# Patient Record
Sex: Female | Born: 1950 | Race: White | Hispanic: No | Marital: Married | Smoking: Former smoker
Health system: Southern US, Community
[De-identification: ages and names within clinical notes are randomized; demographics above are authoritative.]

## PROBLEM LIST (undated history)

## (undated) DIAGNOSIS — E7800 Pure hypercholesterolemia, unspecified: Secondary | ICD-10-CM

## (undated) DIAGNOSIS — M1990 Unspecified osteoarthritis, unspecified site: Secondary | ICD-10-CM

## (undated) DIAGNOSIS — K219 Gastro-esophageal reflux disease without esophagitis: Secondary | ICD-10-CM

## (undated) DIAGNOSIS — S060XAA Concussion with loss of consciousness status unknown, initial encounter: Secondary | ICD-10-CM

## (undated) DIAGNOSIS — E119 Type 2 diabetes mellitus without complications: Secondary | ICD-10-CM

## (undated) DIAGNOSIS — Z973 Presence of spectacles and contact lenses: Secondary | ICD-10-CM

## (undated) DIAGNOSIS — E039 Hypothyroidism, unspecified: Secondary | ICD-10-CM

## (undated) HISTORY — PX: APPENDECTOMY: SHX54

## (undated) HISTORY — PX: OVARIAN CYST REMOVAL: SHX89

---

## 1998-09-11 ENCOUNTER — Emergency Department (HOSPITAL_COMMUNITY): Admission: EM | Admit: 1998-09-11 | Discharge: 1998-09-11 | Payer: Self-pay

## 2000-02-02 ENCOUNTER — Other Ambulatory Visit: Admission: RE | Admit: 2000-02-02 | Discharge: 2000-02-02 | Payer: Self-pay | Admitting: Obstetrics and Gynecology

## 2002-08-03 HISTORY — PX: OVARIAN CYST REMOVAL: SHX89

## 2004-03-25 ENCOUNTER — Other Ambulatory Visit: Admission: RE | Admit: 2004-03-25 | Discharge: 2004-03-25 | Payer: Self-pay | Admitting: *Deleted

## 2007-09-27 ENCOUNTER — Other Ambulatory Visit: Admission: RE | Admit: 2007-09-27 | Discharge: 2007-09-27 | Payer: Self-pay | Admitting: Family Medicine

## 2014-11-29 ENCOUNTER — Other Ambulatory Visit: Payer: Self-pay | Admitting: Family Medicine

## 2014-11-29 DIAGNOSIS — E049 Nontoxic goiter, unspecified: Secondary | ICD-10-CM

## 2014-11-30 ENCOUNTER — Ambulatory Visit (INDEPENDENT_AMBULATORY_CARE_PROVIDER_SITE_OTHER): Payer: BC Managed Care – PPO

## 2014-11-30 DIAGNOSIS — E049 Nontoxic goiter, unspecified: Secondary | ICD-10-CM

## 2014-11-30 DIAGNOSIS — E042 Nontoxic multinodular goiter: Secondary | ICD-10-CM | POA: Diagnosis not present

## 2014-12-26 ENCOUNTER — Other Ambulatory Visit: Payer: Self-pay | Admitting: Surgery

## 2014-12-26 DIAGNOSIS — E042 Nontoxic multinodular goiter: Secondary | ICD-10-CM

## 2014-12-27 ENCOUNTER — Other Ambulatory Visit: Payer: Self-pay | Admitting: Surgery

## 2014-12-27 DIAGNOSIS — E042 Nontoxic multinodular goiter: Secondary | ICD-10-CM

## 2015-01-16 ENCOUNTER — Other Ambulatory Visit (HOSPITAL_COMMUNITY)
Admission: RE | Admit: 2015-01-16 | Discharge: 2015-01-16 | Disposition: A | Discharge status: Home / Self Care | Payer: BC Managed Care – PPO | Source: Ambulatory Visit | Attending: Diagnostic Radiology | Admitting: Diagnostic Radiology

## 2015-01-16 ENCOUNTER — Ambulatory Visit: Admission: RE | Admit: 2015-01-16 | Payer: BC Managed Care – PPO | Source: Ambulatory Visit

## 2015-01-16 ENCOUNTER — Ambulatory Visit
Admission: RE | Admit: 2015-01-16 | Discharge: 2015-01-16 | Disposition: A | Discharge status: Home / Self Care | Payer: BC Managed Care – PPO | Source: Ambulatory Visit | Attending: Surgery | Admitting: Surgery

## 2015-01-16 DIAGNOSIS — E042 Nontoxic multinodular goiter: Secondary | ICD-10-CM | POA: Diagnosis not present

## 2015-01-22 ENCOUNTER — Other Ambulatory Visit: Payer: Self-pay | Admitting: Surgery

## 2015-01-22 DIAGNOSIS — E042 Nontoxic multinodular goiter: Secondary | ICD-10-CM

## 2015-07-03 ENCOUNTER — Other Ambulatory Visit: Payer: Self-pay | Admitting: Surgery

## 2015-07-03 DIAGNOSIS — E042 Nontoxic multinodular goiter: Secondary | ICD-10-CM

## 2015-07-08 ENCOUNTER — Ambulatory Visit
Admission: RE | Admit: 2015-07-08 | Discharge: 2015-07-08 | Disposition: A | Discharge status: Home / Self Care | Payer: BC Managed Care – PPO | Source: Ambulatory Visit | Attending: Surgery | Admitting: Surgery

## 2015-07-08 DIAGNOSIS — E042 Nontoxic multinodular goiter: Secondary | ICD-10-CM

## 2016-07-15 ENCOUNTER — Other Ambulatory Visit: Payer: Self-pay | Admitting: Surgery

## 2016-07-15 DIAGNOSIS — E049 Nontoxic goiter, unspecified: Secondary | ICD-10-CM

## 2016-07-23 ENCOUNTER — Ambulatory Visit
Admission: RE | Admit: 2016-07-23 | Discharge: 2016-07-23 | Disposition: A | Discharge status: Home / Self Care | Payer: Medicare Other | Source: Ambulatory Visit | Attending: Surgery | Admitting: Surgery

## 2016-07-23 DIAGNOSIS — E049 Nontoxic goiter, unspecified: Secondary | ICD-10-CM

## 2017-01-28 ENCOUNTER — Other Ambulatory Visit (HOSPITAL_COMMUNITY): Payer: Self-pay | Admitting: Dermatology

## 2017-01-28 DIAGNOSIS — D485 Neoplasm of uncertain behavior of skin: Secondary | ICD-10-CM

## 2017-02-02 ENCOUNTER — Ambulatory Visit (HOSPITAL_COMMUNITY)
Admission: RE | Admit: 2017-02-02 | Discharge: 2017-02-02 | Disposition: A | Discharge status: Home / Self Care | Payer: Medicare Other | Source: Ambulatory Visit | Attending: Dermatology | Admitting: Dermatology

## 2017-02-02 DIAGNOSIS — D485 Neoplasm of uncertain behavior of skin: Secondary | ICD-10-CM | POA: Insufficient documentation

## 2017-07-19 ENCOUNTER — Other Ambulatory Visit: Payer: Self-pay | Admitting: Surgery

## 2017-07-19 DIAGNOSIS — E042 Nontoxic multinodular goiter: Secondary | ICD-10-CM

## 2017-08-24 ENCOUNTER — Ambulatory Visit
Admission: RE | Admit: 2017-08-24 | Discharge: 2017-08-24 | Disposition: A | Discharge status: Home / Self Care | Payer: Medicare Other | Source: Ambulatory Visit | Attending: Surgery | Admitting: Surgery

## 2017-08-24 DIAGNOSIS — E042 Nontoxic multinodular goiter: Secondary | ICD-10-CM

## 2017-09-01 ENCOUNTER — Other Ambulatory Visit: Payer: Self-pay | Admitting: Surgery

## 2017-09-01 DIAGNOSIS — E042 Nontoxic multinodular goiter: Secondary | ICD-10-CM

## 2017-09-08 ENCOUNTER — Other Ambulatory Visit (HOSPITAL_COMMUNITY)
Admission: RE | Admit: 2017-09-08 | Discharge: 2017-09-08 | Disposition: A | Discharge status: Home / Self Care | Payer: Medicare Other | Source: Ambulatory Visit | Attending: Student | Admitting: Student

## 2017-09-08 ENCOUNTER — Ambulatory Visit
Admission: RE | Admit: 2017-09-08 | Discharge: 2017-09-08 | Disposition: A | Discharge status: Home / Self Care | Payer: Medicare Other | Source: Ambulatory Visit | Attending: Surgery | Admitting: Surgery

## 2017-09-08 DIAGNOSIS — E042 Nontoxic multinodular goiter: Secondary | ICD-10-CM | POA: Diagnosis present

## 2018-08-26 ENCOUNTER — Other Ambulatory Visit: Payer: Self-pay | Admitting: Surgery

## 2018-08-26 DIAGNOSIS — E042 Nontoxic multinodular goiter: Secondary | ICD-10-CM

## 2018-08-31 ENCOUNTER — Ambulatory Visit
Admission: RE | Admit: 2018-08-31 | Discharge: 2018-08-31 | Disposition: A | Discharge status: Home / Self Care | Payer: Medicare Other | Source: Ambulatory Visit | Attending: Surgery | Admitting: Surgery

## 2018-08-31 DIAGNOSIS — E042 Nontoxic multinodular goiter: Secondary | ICD-10-CM

## 2018-09-12 ENCOUNTER — Other Ambulatory Visit (HOSPITAL_COMMUNITY): Payer: Self-pay

## 2018-09-12 DIAGNOSIS — R1310 Dysphagia, unspecified: Secondary | ICD-10-CM

## 2018-09-23 ENCOUNTER — Ambulatory Visit (HOSPITAL_COMMUNITY)
Admission: RE | Admit: 2018-09-23 | Discharge: 2018-09-23 | Disposition: A | Discharge status: Home / Self Care | Payer: Medicare Other | Source: Ambulatory Visit | Attending: Surgery | Admitting: Surgery

## 2018-09-23 DIAGNOSIS — R1310 Dysphagia, unspecified: Secondary | ICD-10-CM

## 2018-09-23 NOTE — Progress Notes (Signed)
Modified Barium Swallow Progress Note  Patient Details  Name: Karina Herman MRN: [id number] Date of Birth: 1950-11-26  Today's Date: 09/23/2018  Modified Barium Swallow completed.  Full report located under Chart Review in the Imaging Section.  Brief recommendations include the following:  Clinical Impression  Pt's oropharyngeal swallow is WFL with good timing, efficiency, and airway protection. This is no aspiration and no pharyngeal residue. Pt did have mild, transient residue in her cervical esophagus, which may suggest the beginnings of an outpouching, but any residue that was present upon completion of the swallow was fully cleared before the next swallow began. The pill appeared to clear her esophagus well, although requiring an additional liquid bolus. Recommend continuing regular solids and thin liquids as tolerated. A handout was provided about esophageal precautions - could consider an esophageal w/u if symptoms persist given pt report of heartburn.   Swallow Evaluation Recommendations   Recommended Consults: Consider esophageal assessment   SLP Diet Recommendations: Regular solids;Thin liquid   Liquid Administration via: Cup;Straw   Medication Administration: Whole meds with liquid   Supervision: Patient able to self feed   Compensations: Slow rate;Small sips/bites;Follow solids with liquid   Postural Changes: Seated upright at 90 degrees;Remain semi-upright after after feeds/meals (Comment)   Oral Care Recommendations: Oral care BID        Venita Sheffield Nayeliz Hipp 09/23/2018,11:35 AM   Nuala Alpha, M.A. [place] [facility] Environmental education officer [phone] Office [phone]

## 2019-01-27 ENCOUNTER — Ambulatory Visit: Payer: Self-pay | Admitting: Surgery

## 2019-01-27 NOTE — H&P (Signed)
General Surgery [facility]  Karina Herman DOB: 1950/10/30 Married / Language: Cleophus Molt / Race: White Female   History of Present Illness   The patient is a 68 year old female who presents with a complaint of Enlarged thyroid.  CHIEF COMPLAINT: multinodular thyroid goiter with compressive symptoms  The patient returns today to discuss results of her speech pathology evaluation and swallowing studies. Patient has a multinodular thyroid goiter with bilateral thyroid nodules. Her most recent ultrasound exam showed some changes in the character of bilateral nodules and fine-needle aspiration biopsies were recommended. However, the patient continues to have compressive symptoms and is interested in thyroidectomy. Prior to thyroid surgery we asked for an evaluation including a barium swallow and a speech pathology evaluation. Both of these were completed the spring. Both studies are within normal limits. No significant pathology was identified.  Patient continues to have mild dysphagia. She has problems with large pills. She feels compression in the upper neck. She is interested in proceeding with thyroidectomy to avoid further symptoms and to avoid the need for further ultrasound examinations and biopsies.   Problem List/Past Medical MULTINODULAR GOITER (NONTOXIC) (E04.2)  PILL DYSPHAGIA (R13.10)   Past Surgical History  Appendectomy  Colon Polyp Removal - Colonoscopy  Oral Surgery  Tonsillectomy   Diagnostic Studies History Colonoscopy  5-10 years ago Mammogram  within last year Pap Smear  1-5 years ago  Allergies  No Known Allergies  [12/21/2018]: No Known Drug Allergies  [12/26/2014]: Allergies Reconciled   Medication History  Simvastatin (20MG  Tablet, Oral daily) Active. FreeStyle Lancets (daily) Active. FreeStyle Lite Test (In Vitro daily) Active. Lancets 30G (daily) Active. MetFORMIN HCl (500MG  Tablet, Oral)  Active. Medications Reconciled  Social History  Alcohol use  Occasional alcohol use. Caffeine use  Coffee. No drug use  Tobacco use  Former smoker.  Family History  Cerebrovascular Accident  Mother. Malignant Neoplasm Of Pancreas  Father.  Pregnancy / Birth History Age at menarche  41 years. Age of menopause  38-55 Gravida  2 Maternal age  33-35 Para  2  Other Problems Diabetes Mellitus  Hypercholesterolemia   Vitals Weight: 193.38 lb Height: 63in Body Surface Area: 1.91 m Body Mass Index: 34.25 kg/m  Temp.: 98.45F (Oral)  Pulse: 94 (Regular)  BP: 110/62(Sitting, Left Arm, Standard)  Physical Exam  See vital signs recorded above  GENERAL APPEARANCE Development: normal Nutritional status: normal Gross deformities: none  SKIN Rash, lesions, ulcers: none Induration, erythema: none Nodules: none palpable  EYES Conjunctiva and lids: normal Pupils: equal and reactive Iris: normal bilaterally  EARS, NOSE, MOUTH, THROAT External ears: no lesion or deformity External nose: no lesion or deformity Hearing: grossly normal Lips: no lesion or deformity Dentition: normal for age Oral mucosa: moist  NECK Symmetric: yes Trachea: midline Thyroid: Thyroid gland is moderately to markedly enlarged bilaterally. It is visible on extension of the neck. It is quite firm. There are multiple nodular areas on each lobe of the thyroid gland. There is no tenderness. There is no associated lymphadenopathy.  CHEST Respiratory effort: normal Retraction or accessory muscle use: no Breath sounds: normal bilaterally Rales, rhonchi, wheeze: none  CARDIOVASCULAR Auscultation: regular rhythm, normal rate Murmurs: none Pulses: carotid and radial pulse 2+ palpable Lower extremity edema: none Lower extremity varicosities: none  MUSCULOSKELETAL Station and gait: normal Digits and nails: no clubbing or cyanosis Muscle strength: grossly normal all  extremities Range of motion: grossly normal all extremities Deformity: none  LYMPHATIC Cervical: none palpable Supraclavicular: none  palpable  PSYCHIATRIC Oriented to person, place, and time: yes Mood and affect: normal for situation Judgment and insight: appropriate for situation    Assessment & Plan  MULTINODULAR GOITER (NONTOXIC) (E04.2)   PILL DYSPHAGIA (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 We reviewed the results of her speech pathology evaluation and her barium swallow. I provided her with a copy of the reports from those evaluations.  We discussed the options for management including continued observation with ultrasound examinations and fine-needle aspiration biopsies. Alternatively, we could consider total thyroidectomy. Patient would like to proceed with thyroid surgery. We discussed total thyroidectomy. We discussed the location of the surgical incision. We discussed potential complications including recurrent laryngeal nerve injury and injury to parathyroid glands. We discussed the need for lifelong thyroid hormone replacement. We discussed the hospital stay to be anticipated in the postoperative recovery. Patient understands and wishes to proceed with surgery in the near future.  We will submit orders to the scheduling department for her procedure. However, given the virus pandemic, operating room time is limited and it may be several weeks or a few months until we can get her into the operating room schedule. Patient is aware of these issues and is willing to wait until time is available.  The risks and benefits of the procedure have been discussed at length with the patient. The patient understands the proposed procedure, potential alternative treatments, and the course of recovery to be expected. All of the patient's questions have been answered at this time. The patient wishes to proceed with surgery.  Armandina Gemma, [place] [facility] Office: [phone]

## 2019-02-16 NOTE — Pre-Procedure Instructions (Signed)
Karina Herman  02/16/2019      [id number] - [place], [place] - [address] [place]  [address] Phone: [phone] Fax: [phone]    Your procedure is scheduled on Tues., February 21, 2019 from 7:30AM-9:30AM  Report to [facility] Entrance "A" at 5:30AM  Call this number if you have problems the morning of surgery:  [phone]   Remember:  Do not eat or drink after midnight on July 20th    Take these medicines the morning of surgery with A SIP OF WATER: NONE  As of today, stop taking all Aspirin (unless instructed by your doctor) and Other Aspirin containing products, Vitamins, Fish oils, and Herbal medications. Also stop all NSAIDS i.e. Advil, Ibuprofen, Motrin, Aleve, Anaprox, Naproxen, BC, Goody Powders, and all Supplements.   . Do not take MetFORMIN (GLUCOPHAGE) the morning of surgery.  How to Manage Your Diabetes Before and After Surgery  Why is it important to control my blood sugar before and after surgery? . Improving blood sugar levels before and after surgery helps healing and can limit problems. . A way of improving blood sugar control is eating a healthy diet by: o  Eating less sugar and carbohydrates o  Increasing activity/exercise o  Talking with your doctor about reaching your blood sugar goals . High blood sugars (greater than 180 mg/dL) can raise your risk of infections and slow your recovery, so you will need to focus on controlling your diabetes during the weeks before surgery. . Make sure that the doctor who takes care of your diabetes knows about your planned surgery including the date and location.  How do I manage my blood sugar before surgery? . Check your blood sugar at least 4 times a day, starting 2 days before surgery, to make sure that the level is not too high or low. o Check your blood sugar the morning of your surgery when you wake up and every 2 hours until you get to the Short  Stay unit. . If your blood sugar is less than 70 mg/dL, you will need to treat for low blood sugar: o Do not take insulin. o Treat a low blood sugar (less than 70 mg/dL) with  cup of clear juice (cranberry or apple), 4 glucose tablets, OR glucose gel. Recheck blood sugar in 15 minutes after treatment (to make sure it is greater than 70 mg/dL). If your blood sugar is not greater than 70 mg/dL on recheck, call [phone] o  for further instructions.                       . If your CBG is greater than 220 mg/dL, inform the staff upon arrival to Short Stay.  . If you are admitted to the hospital after surgery: o Your blood sugar will be checked by the staff and you will probably be given insulin after surgery (instead of oral diabetes medicines) to make sure you have good blood sugar levels. o The goal for blood sugar control after surgery is 80-180 mg/dL.  Reviewed and Endorsed by [facility] Patient Education Committee, August 2015   Special instructions:   [facility]- Preparing For Surgery  Before surgery, you can play an important role. Because skin is not sterile, your skin needs to be as free of germs as possible. You can reduce the number of germs on your skin by washing with CHG (chlorahexidine gluconate) Soap  before surgery.  CHG is an antiseptic cleaner which kills germs and bonds with the skin to continue killing germs even after washing.    Please do not use if you have an allergy to CHG or antibacterial soaps. If your skin becomes reddened/irritated stop using the CHG.  Do not shave (including legs and underarms) for at least 48 hours prior to first CHG shower. It is OK to shave your face.  Please follow these instructions carefully.   1. Shower the NIGHT BEFORE SURGERY and the MORNING OF SURGERY with CHG.   2. If you chose to wash your hair, wash your hair first as usual with your normal shampoo.  3. After you shampoo, rinse your hair and body thoroughly to remove the  shampoo.  4. Use CHG as you would any other liquid soap. You can apply CHG directly to the skin and wash gently with a scrungie or a clean washcloth.   5. Apply the CHG Soap to your body ONLY FROM THE NECK DOWN.  Do not use on open wounds or open sores. Avoid contact with your eyes, ears, mouth and genitals (private parts). Wash Face and genitals (private parts)  with your normal soap.  6. Wash thoroughly, paying special attention to the area where your surgery will be performed.  7. Thoroughly rinse your body with warm water from the neck down.  8. DO NOT shower/wash with your normal soap after using and rinsing off the CHG Soap.  9. Pat yourself dry with a CLEAN TOWEL.  10. Wear CLEAN PAJAMAS to bed the night before surgery, wear comfortable clothes the morning of surgery  11. Place CLEAN SHEETS on your bed the night of your first shower and DO NOT SLEEP WITH PETS.   Day of Surgery:             Remember to brush your teeth WITH YOUR REGULAR TOOTHPASTE.   Do not wear jewelry, make-up or nail polish.  Do not wear lotions, powders, or perfumes, or deodorant.  Do not shave 48 hours prior to surgery.    Do not bring valuables to the hospital.  [facility] is not responsible for any belongings or valuables.  Contacts, dentures or bridgework may not be worn into surgery.   For patients admitted to the hospital, discharge time will be determined by your treatment team.  Patients discharged the day of surgery will not be allowed to drive home.   Please wear clean clothes to the hospital/surgery center.     Please read over the following fact sheets that you were given. Pain Booklet, Coughing and Deep Breathing and Surgical Site Infection Prevention

## 2019-02-17 ENCOUNTER — Other Ambulatory Visit: Payer: Self-pay

## 2019-02-17 ENCOUNTER — Ambulatory Visit (HOSPITAL_COMMUNITY)
Admission: RE | Admit: 2019-02-17 | Discharge: 2019-02-17 | Disposition: A | Discharge status: Home / Self Care | Payer: Medicare Other | Source: Ambulatory Visit | Attending: Anesthesiology | Admitting: Anesthesiology

## 2019-02-17 ENCOUNTER — Other Ambulatory Visit (HOSPITAL_COMMUNITY)
Admission: RE | Admit: 2019-02-17 | Discharge: 2019-02-17 | Disposition: A | Discharge status: Home / Self Care | Payer: Medicare Other | Source: Ambulatory Visit | Attending: Surgery | Admitting: Surgery

## 2019-02-17 ENCOUNTER — Encounter (HOSPITAL_COMMUNITY)
Admission: RE | Admit: 2019-02-17 | Discharge: 2019-02-17 | Disposition: A | Discharge status: Home / Self Care | Payer: Medicare Other | Source: Ambulatory Visit | Attending: Surgery | Admitting: Surgery

## 2019-02-17 ENCOUNTER — Encounter (HOSPITAL_COMMUNITY): Payer: Self-pay

## 2019-02-17 ENCOUNTER — Encounter (HOSPITAL_COMMUNITY): Payer: Self-pay | Admitting: Surgery

## 2019-02-17 DIAGNOSIS — Z79899 Other long term (current) drug therapy: Secondary | ICD-10-CM | POA: Insufficient documentation

## 2019-02-17 DIAGNOSIS — Z01818 Encounter for other preprocedural examination: Secondary | ICD-10-CM | POA: Insufficient documentation

## 2019-02-17 DIAGNOSIS — E7800 Pure hypercholesterolemia, unspecified: Secondary | ICD-10-CM | POA: Diagnosis not present

## 2019-02-17 DIAGNOSIS — Z7984 Long term (current) use of oral hypoglycemic drugs: Secondary | ICD-10-CM | POA: Insufficient documentation

## 2019-02-17 DIAGNOSIS — Z1159 Encounter for screening for other viral diseases: Secondary | ICD-10-CM | POA: Diagnosis not present

## 2019-02-17 DIAGNOSIS — E042 Nontoxic multinodular goiter: Secondary | ICD-10-CM | POA: Diagnosis not present

## 2019-02-17 DIAGNOSIS — R1310 Dysphagia, unspecified: Secondary | ICD-10-CM | POA: Insufficient documentation

## 2019-02-17 DIAGNOSIS — E119 Type 2 diabetes mellitus without complications: Secondary | ICD-10-CM | POA: Insufficient documentation

## 2019-02-17 HISTORY — DX: Type 2 diabetes mellitus without complications: E11.9

## 2019-02-17 HISTORY — DX: Unspecified osteoarthritis, unspecified site: M19.90

## 2019-02-17 HISTORY — DX: Pure hypercholesterolemia, unspecified: E78.00

## 2019-02-17 LAB — BASIC METABOLIC PANEL
Anion gap: 15 (ref 5–15)
BUN: 15 mg/dL (ref 8–23)
CO2: 22 mmol/L (ref 22–32)
Calcium: 9.9 mg/dL (ref 8.9–10.3)
Chloride: 102 mmol/L (ref 98–111)
Creatinine, Ser: 0.95 mg/dL (ref 0.44–1.00)
GFR calc Af Amer: >60 mL/min (ref >60)
GFR calc non Af Amer: >60 mL/min (ref >60)
Glucose, Bld: 220 mg/dL — ABNORMAL HIGH (ref 70–99)
Potassium: 4.3 mmol/L (ref 3.5–5.1)
Sodium: 139 mmol/L (ref 135–145)

## 2019-02-17 LAB — CBC
HCT: 43.3 % (ref 36.0–46.0)
Hemoglobin: 13.9 g/dL (ref 12.0–15.0)
MCH: 29.4 pg (ref 26.0–34.0)
MCHC: 32.1 g/dL (ref 30.0–36.0)
MCV: 91.7 fL (ref 80.0–100.0)
Platelets: 311 10*3/uL (ref 150–400)
RBC: 4.72 MIL/uL (ref 3.87–5.11)
RDW: 13 % (ref 11.5–15.5)
WBC: 9.1 10*3/uL (ref 4.0–10.5)
nRBC: 0 % (ref 0.0–0.2)

## 2019-02-17 LAB — SARS CORONAVIRUS 2 (TAT 6-24 HRS): SARS Coronavirus 2: NEGATIVE

## 2019-02-17 LAB — GLUCOSE, CAPILLARY: Glucose-Capillary: 191 mg/dL — ABNORMAL HIGH (ref 70–99)

## 2019-02-17 LAB — HEMOGLOBIN A1C
Hgb A1c MFr Bld: 8.4 % — ABNORMAL HIGH (ref 4.8–5.6)
Mean Plasma Glucose: 194.38 mg/dL

## 2019-02-17 NOTE — Progress Notes (Signed)
PCP -  Dr. Orpah Melter; Mat Carne, NP Cardiologist - patient denies  Chest x-ray - 02/17/2019 EKG - 02/17/2019 Stress Test - patient denies ECHO - patient denies Cardiac Cath - patient denies  Sleep Study - patient denies CPAP -   Fasting Blood Sugar - unsure, maybe 150's; patient states she does not check it often. Checks Blood Sugar 0 times a day  Blood Thinner Instructions: Aspirin Instructions:  Anesthesia review: n/a  Patient denies shortness of breath, fever, cough and chest pain at PAT appointment  Coronavirus Screening  Have you experienced the following symptoms:  Cough yes/no: No Fever (>100.98F)  yes/no: No Runny nose yes/no: No Sore throat yes/no: No Difficulty breathing/shortness of breath  yes/no: No  Have you or a family member traveled in the last 14 days and where? yes/no: No   If the patient indicates "YES" to the above questions, their PAT will be rescheduled to limit the exposure to others and, the surgeon will be notified. THE PATIENT WILL NEED TO BE ASYMPTOMATIC FOR 14 DAYS.   If the patient is not experiencing any of these symptoms, the PAT nurse will instruct them to NOT bring anyone with them to their appointment since they may have these symptoms or traveled as well.   Please remind your patients and families that hospital visitation restrictions are in effect and the importance of the restrictions.    Patient verbalized understanding of instructions that were given to them at the PAT appointment. Patient was also instructed that they will need to review over the PAT instructions again at home before surgery.

## 2019-02-17 NOTE — H&P (Signed)
General Surgery [facility]  Lyna Poser DOB: Apr 01, 1951 Married / Language: Cleophus Molt / Race: White Female   History of Present Illness  The patient is a 68 year old female who presents with a complaint of Enlarged thyroid.  CHIEF COMPLAINT: multinodular thyroid goiter with compressive symptoms  The patient returns today to discuss results of her speech pathology evaluation and swallowing studies. Patient has a multinodular thyroid goiter with bilateral thyroid nodules. Her most recent ultrasound exam showed some changes in the character of bilateral nodules and fine-needle aspiration biopsies were recommended. However, the patient continues to have compressive symptoms and is interested in thyroidectomy. Prior to thyroid surgery we asked for an evaluation including a barium swallow and a speech pathology evaluation. Both of these were completed the spring. Both studies are within normal limits. No significant pathology was identified.  Patient continues to have mild dysphagia. She has problems with large pills. She feels compression in the upper neck. She is interested in proceeding with thyroidectomy to avoid further symptoms and to avoid the need for further ultrasound examinations and biopsies.   Problem List/Past Medical MULTINODULAR GOITER (NONTOXIC) (E04.2)  PILL DYSPHAGIA (R13.10)   Past Surgical History  Appendectomy  Colon Polyp Removal - Colonoscopy  Oral Surgery  Tonsillectomy   Diagnostic Studies History  Colonoscopy  5-10 years ago Mammogram  within last year Pap Smear  1-5 years ago  Allergies  No Known Allergies  [12/21/2018]: No Known Drug Allergies  [12/26/2014]: Allergies Reconciled   Medication History  Simvastatin (20MG  Tablet, Oral daily) Active. FreeStyle Lancets (daily) Active. FreeStyle Lite Test (In Vitro daily) Active. Lancets 30G (daily) Active. MetFORMIN HCl (500MG  Tablet, Oral)  Active. Medications Reconciled  Social History  Alcohol use  Occasional alcohol use. Caffeine use  Coffee. No drug use  Tobacco use  Former smoker.  Family History  Cerebrovascular Accident  Mother. Malignant Neoplasm Of Pancreas  Father.  Pregnancy / Birth History Age at menarche  52 years. Age of menopause  69-55 Gravida  2 Maternal age  34-35 Para  2  Other Problems Diabetes Mellitus  Hypercholesterolemia   Vitals  Weight: 193.38 lb Height: 63in Body Surface Area: 1.91 m Body Mass Index: 34.25 kg/m  Temp.: 98.72F (Oral)  Pulse: 94 (Regular)  BP: 110/62(Sitting, Left Arm, Standard)   Physical Exam   See vital signs recorded above  GENERAL APPEARANCE Development: normal Nutritional status: normal Gross deformities: none  SKIN Rash, lesions, ulcers: none Induration, erythema: none Nodules: none palpable  EYES Conjunctiva and lids: normal Pupils: equal and reactive Iris: normal bilaterally  EARS, NOSE, MOUTH, THROAT External ears: no lesion or deformity External nose: no lesion or deformity Hearing: grossly normal Lips: no lesion or deformity Dentition: normal for age Oral mucosa: moist  NECK Symmetric: yes Trachea: midline Thyroid: Thyroid gland is moderately to markedly enlarged bilaterally. It is visible on extension of the neck. It is quite firm. There are multiple nodular areas on each lobe of the thyroid gland. There is no tenderness. There is no associated lymphadenopathy.  CHEST Respiratory effort: normal Retraction or accessory muscle use: no Breath sounds: normal bilaterally Rales, rhonchi, wheeze: none  CARDIOVASCULAR Auscultation: regular rhythm, normal rate Murmurs: none Pulses: carotid and radial pulse 2+ palpable Lower extremity edema: none Lower extremity varicosities: none  MUSCULOSKELETAL Station and gait: normal Digits and nails: no clubbing or cyanosis Muscle strength: grossly normal all  extremities Range of motion: grossly normal all extremities Deformity: none  LYMPHATIC Cervical: none  palpable Supraclavicular: none palpable  PSYCHIATRIC Oriented to person, place, and time: yes Mood and affect: normal for situation Judgment and insight: appropriate for situation    Assessment & Plan  MULTINODULAR GOITER (NONTOXIC) (E04.2) PILL DYSPHAGIA (R13.10)  Patient presents today to discuss thyroid surgery. We reviewed the results of her speech pathology evaluation and her barium swallow. I provided her with a copy of the reports from those evaluations.  We discussed the options for management including continued observation with ultrasound examinations and fine-needle aspiration biopsies. Alternatively, we could consider total thyroidectomy. Patient would like to proceed with thyroid surgery. We discussed total thyroidectomy. We discussed the location of the surgical incision. We discussed potential complications including recurrent laryngeal nerve injury and injury to parathyroid glands. We discussed the need for lifelong thyroid hormone replacement. We discussed the hospital stay to be anticipated in the postoperative recovery. Patient understands and wishes to proceed with surgery in the near future.  We will submit orders to the scheduling department for her procedure. However, given the virus pandemic, operating room time is limited and it may be several weeks or a few months until we can get her into the operating room schedule. Patient is aware of these issues and is willing to wait until time is available.  The risks and benefits of the procedure have been discussed at length with the patient. The patient understands the proposed procedure, potential alternative treatments, and the course of recovery to be expected. All of the patient's questions have been answered at this time. The patient wishes to proceed with surgery.   Armandina Gemma, [place] [facility] Office: [phone]

## 2019-02-19 ENCOUNTER — Encounter (HOSPITAL_COMMUNITY): Payer: Self-pay | Admitting: Surgery

## 2019-02-20 NOTE — Anesthesia Preprocedure Evaluation (Addendum)
Anesthesia Evaluation  Patient identified by MRN, date of birth, ID band Patient awake    Reviewed: Allergy & Precautions, NPO status , Patient's Chart, lab work & pertinent test results  History of Anesthesia Complications Negative for: history of anesthetic complications  Airway Mallampati: II  TM Distance: >3 FB Neck ROM: Full    Dental  (+) Dental Advisory Given   Pulmonary former smoker,  02/17/2019 SARS Coronavirus neg   breath sounds clear to auscultation       Cardiovascular (-) hypertension(-) angina Rhythm:Regular Rate:Normal     Neuro/Psych negative neurological ROS     GI/Hepatic negative GI ROS, Neg liver ROS,   Endo/Other  diabetes (glu 182), Oral Hypoglycemic AgentsMorbid obesity  Renal/GU negative Renal ROS     Musculoskeletal  (+) Arthritis ,   Abdominal (+) + obese,   Peds  Hematology negative hematology ROS (+)   Anesthesia Other Findings   Reproductive/Obstetrics                            Anesthesia Physical Anesthesia Plan  ASA: III  Anesthesia Plan: General   Post-op Pain Management:    Induction: Intravenous  PONV Risk Score and Plan: 3 and Dexamethasone, Ondansetron, Scopolamine patch - Pre-op and Treatment may vary due to age or medical condition  Airway Management Planned: Oral ETT  Additional Equipment:   Intra-op Plan:   Post-operative Plan: Extubation in OR  Informed Consent: I have reviewed the patients History and Physical, chart, labs and discussed the procedure including the risks, benefits and alternatives for the proposed anesthesia with the patient or authorized representative who has indicated his/her understanding and acceptance.     Dental advisory given  Plan Discussed with: CRNA and Surgeon  Anesthesia Plan Comments:        Anesthesia Quick Evaluation

## 2019-02-21 ENCOUNTER — Ambulatory Visit (HOSPITAL_COMMUNITY): Payer: Medicare Other | Admitting: Anesthesiology

## 2019-02-21 ENCOUNTER — Encounter (HOSPITAL_COMMUNITY): Payer: Self-pay | Admitting: Certified Registered"

## 2019-02-21 ENCOUNTER — Encounter (HOSPITAL_COMMUNITY)
Admission: RE | Disposition: A | Discharge status: Home / Self Care | Payer: Self-pay | Source: Home / Self Care | Attending: Surgery

## 2019-02-21 ENCOUNTER — Ambulatory Visit (HOSPITAL_COMMUNITY): Payer: Medicare Other | Admitting: Vascular Surgery

## 2019-02-21 ENCOUNTER — Observation Stay (HOSPITAL_COMMUNITY)
Admission: RE | Admit: 2019-02-21 | Discharge: 2019-02-22 | Disposition: A | Discharge status: Home / Self Care | Payer: Medicare Other | Attending: Surgery | Admitting: Surgery

## 2019-02-21 ENCOUNTER — Other Ambulatory Visit: Payer: Self-pay

## 2019-02-21 DIAGNOSIS — E042 Nontoxic multinodular goiter: Secondary | ICD-10-CM | POA: Diagnosis present

## 2019-02-21 DIAGNOSIS — Z87891 Personal history of nicotine dependence: Secondary | ICD-10-CM | POA: Diagnosis not present

## 2019-02-21 DIAGNOSIS — Z7984 Long term (current) use of oral hypoglycemic drugs: Secondary | ICD-10-CM | POA: Diagnosis not present

## 2019-02-21 DIAGNOSIS — E7800 Pure hypercholesterolemia, unspecified: Secondary | ICD-10-CM | POA: Diagnosis not present

## 2019-02-21 DIAGNOSIS — Z79899 Other long term (current) drug therapy: Secondary | ICD-10-CM | POA: Diagnosis not present

## 2019-02-21 DIAGNOSIS — E119 Type 2 diabetes mellitus without complications: Secondary | ICD-10-CM | POA: Diagnosis not present

## 2019-02-21 DIAGNOSIS — Z6834 Body mass index (BMI) 34.0-34.9, adult: Secondary | ICD-10-CM | POA: Insufficient documentation

## 2019-02-21 HISTORY — PX: THYROIDECTOMY: SHX17

## 2019-02-21 LAB — GLUCOSE, CAPILLARY
Glucose-Capillary: 182 mg/dL — ABNORMAL HIGH (ref 70–99)
Glucose-Capillary: 277 mg/dL — ABNORMAL HIGH (ref 70–99)

## 2019-02-21 SURGERY — THYROIDECTOMY
Anesthesia: General | Site: Neck

## 2019-02-21 MED ORDER — ONDANSETRON HCL 4 MG/2ML IJ SOLN
INTRAMUSCULAR | Status: DC | PRN
  Administered 2019-02-21: 4 mg via INTRAVENOUS

## 2019-02-21 MED ORDER — EPHEDRINE SULFATE-NACL 50-0.9 MG/10ML-% IV SOSY
PREFILLED_SYRINGE | INTRAVENOUS | Status: DC | PRN
  Administered 2019-02-21: 5 mg via INTRAVENOUS
  Administered 2019-02-21: 15 mg via INTRAVENOUS

## 2019-02-21 MED ORDER — PHENYLEPHRINE HCL (PRESSORS) 10 MG/ML IV SOLN
INTRAVENOUS | Status: DC | PRN
  Administered 2019-02-21 (×2): 80 ug via INTRAVENOUS

## 2019-02-21 MED ORDER — PROPOFOL 10 MG/ML IV BOLUS
INTRAVENOUS | Status: DC | PRN
  Administered 2019-02-21: 50 mg via INTRAVENOUS
  Administered 2019-02-21: 150 mg via INTRAVENOUS

## 2019-02-21 MED ORDER — MEPERIDINE HCL 25 MG/ML IJ SOLN: 6.2500 mg | INTRAMUSCULAR | Status: DC | PRN

## 2019-02-21 MED ORDER — ONDANSETRON 4 MG PO TBDP: 4.0000 mg | ORAL_TABLET | Freq: Four times a day (QID) | ORAL | Status: DC | PRN

## 2019-02-21 MED ORDER — HEMOSTATIC AGENTS (NO CHARGE) OPTIME
TOPICAL | Status: DC | PRN
  Administered 2019-02-21: 1 via TOPICAL

## 2019-02-21 MED ORDER — LACTATED RINGERS IV SOLN
INTRAVENOUS | Status: DC | PRN
  Administered 2019-02-21 (×2): via INTRAVENOUS

## 2019-02-21 MED ORDER — STERILE WATER FOR IRRIGATION IR SOLN
Status: DC | PRN
  Administered 2019-02-21: 1000 mL

## 2019-02-21 MED ORDER — LIDOCAINE 2% (20 MG/ML) 5 ML SYRINGE
INTRAMUSCULAR | Status: DC | PRN
  Administered 2019-02-21: 100 mg via INTRAVENOUS

## 2019-02-21 MED ORDER — HYDROMORPHONE HCL 1 MG/ML IJ SOLN
1.0000 mg | INTRAMUSCULAR | Status: DC | PRN
  Filled 2019-02-21: qty 1

## 2019-02-21 MED ORDER — ROCURONIUM BROMIDE 10 MG/ML (PF) SYRINGE
PREFILLED_SYRINGE | INTRAVENOUS | Status: DC | PRN
  Administered 2019-02-21: 70 mg via INTRAVENOUS

## 2019-02-21 MED ORDER — CHLORHEXIDINE GLUCONATE CLOTH 2 % EX PADS: 6.0000 | MEDICATED_PAD | Freq: Once | CUTANEOUS | Status: DC

## 2019-02-21 MED ORDER — FENTANYL CITRATE (PF) 250 MCG/5ML IJ SOLN
INTRAMUSCULAR | Status: DC | PRN
  Administered 2019-02-21: 50 ug via INTRAVENOUS
  Administered 2019-02-21: 25 ug via INTRAVENOUS
  Administered 2019-02-21 (×2): 50 ug via INTRAVENOUS
  Administered 2019-02-21: 25 ug via INTRAVENOUS

## 2019-02-21 MED ORDER — CEFAZOLIN SODIUM-DEXTROSE 2-4 GM/100ML-% IV SOLN
2.0000 g | INTRAVENOUS | Status: AC
  Administered 2019-02-21: 08:00:00 2 g via INTRAVENOUS
  Filled 2019-02-21: qty 100

## 2019-02-21 MED ORDER — ACETAMINOPHEN 325 MG PO TABS: 650.0000 mg | ORAL_TABLET | Freq: Four times a day (QID) | ORAL | Status: DC | PRN

## 2019-02-21 MED ORDER — CALCIUM CARBONATE 1250 (500 CA) MG PO TABS
2.0000 | ORAL_TABLET | Freq: Three times a day (TID) | ORAL | Status: DC
  Administered 2019-02-21 – 2019-02-22 (×2): 1000 mg via ORAL
  Filled 2019-02-21 (×3): qty 1

## 2019-02-21 MED ORDER — MIDAZOLAM HCL 2 MG/2ML IJ SOLN
INTRAMUSCULAR | Status: AC
  Filled 2019-02-21: qty 2

## 2019-02-21 MED ORDER — FENTANYL CITRATE (PF) 250 MCG/5ML IJ SOLN
INTRAMUSCULAR | Status: AC
  Filled 2019-02-21: qty 5

## 2019-02-21 MED ORDER — SODIUM CHLORIDE 0.9 % IV SOLN
INTRAVENOUS | Status: DC | PRN
  Administered 2019-02-21: 08:00:00 20 ug/min via INTRAVENOUS

## 2019-02-21 MED ORDER — ALBUTEROL SULFATE HFA 108 (90 BASE) MCG/ACT IN AERS
INHALATION_SPRAY | RESPIRATORY_TRACT | Status: DC | PRN
  Administered 2019-02-21: 5 via RESPIRATORY_TRACT

## 2019-02-21 MED ORDER — HYDROMORPHONE HCL 1 MG/ML IJ SOLN: 0.2500 mg | INTRAMUSCULAR | Status: DC | PRN

## 2019-02-21 MED ORDER — SCOPOLAMINE 1 MG/3DAYS TD PT72
1.0000 | MEDICATED_PATCH | Freq: Once | TRANSDERMAL | Status: AC
  Administered 2019-02-21: 1 via TRANSDERMAL
  Filled 2019-02-21 (×2): qty 1

## 2019-02-21 MED ORDER — TRAMADOL HCL 50 MG PO TABS
50.0000 mg | ORAL_TABLET | Freq: Four times a day (QID) | ORAL | Status: DC | PRN
  Administered 2019-02-21 – 2019-02-22 (×3): 50 mg via ORAL
  Filled 2019-02-21 (×3): qty 1

## 2019-02-21 MED ORDER — METFORMIN HCL 500 MG PO TABS
1000.0000 mg | ORAL_TABLET | Freq: Two times a day (BID) | ORAL | Status: DC
  Administered 2019-02-21 – 2019-02-22 (×2): 1000 mg via ORAL
  Filled 2019-02-21 (×2): qty 2

## 2019-02-21 MED ORDER — MIDAZOLAM HCL 2 MG/2ML IJ SOLN: 0.5000 mg | Freq: Once | INTRAMUSCULAR | Status: DC | PRN

## 2019-02-21 MED ORDER — MIDAZOLAM HCL 5 MG/5ML IJ SOLN
INTRAMUSCULAR | Status: DC | PRN
  Administered 2019-02-21: 2 mg via INTRAVENOUS

## 2019-02-21 MED ORDER — 0.9 % SODIUM CHLORIDE (POUR BTL) OPTIME
TOPICAL | Status: DC | PRN
  Administered 2019-02-21: 1000 mL

## 2019-02-21 MED ORDER — PROPOFOL 10 MG/ML IV BOLUS
INTRAVENOUS | Status: AC
  Filled 2019-02-21: qty 20

## 2019-02-21 MED ORDER — ONDANSETRON HCL 4 MG/2ML IJ SOLN: 4.0000 mg | Freq: Four times a day (QID) | INTRAMUSCULAR | Status: DC | PRN

## 2019-02-21 MED ORDER — DEXAMETHASONE SODIUM PHOSPHATE 10 MG/ML IJ SOLN
INTRAMUSCULAR | Status: DC | PRN
  Administered 2019-02-21: 5 mg via INTRAVENOUS

## 2019-02-21 MED ORDER — PROMETHAZINE HCL 25 MG/ML IJ SOLN: 6.2500 mg | INTRAMUSCULAR | Status: DC | PRN

## 2019-02-21 MED ORDER — KCL IN DEXTROSE-NACL 20-5-0.45 MEQ/L-%-% IV SOLN
INTRAVENOUS | Status: DC
  Administered 2019-02-21: 15:00:00 1 mL via INTRAVENOUS
  Filled 2019-02-21: qty 1000

## 2019-02-21 MED ORDER — PHENYLEPHRINE 40 MCG/ML (10ML) SYRINGE FOR IV PUSH (FOR BLOOD PRESSURE SUPPORT)
PREFILLED_SYRINGE | INTRAVENOUS | Status: DC | PRN
  Administered 2019-02-21: 200 ug via INTRAVENOUS
  Administered 2019-02-21: 120 ug via INTRAVENOUS
  Administered 2019-02-21: 80 ug via INTRAVENOUS

## 2019-02-21 MED ORDER — ACETAMINOPHEN 650 MG RE SUPP: 650.0000 mg | Freq: Four times a day (QID) | RECTAL | Status: DC | PRN

## 2019-02-21 MED ORDER — HYDROCODONE-ACETAMINOPHEN 5-325 MG PO TABS
ORAL_TABLET | ORAL | Status: AC
  Filled 2019-02-21: qty 2

## 2019-02-21 MED ORDER — LACTATED RINGERS IV SOLN: INTRAVENOUS | Status: DC

## 2019-02-21 MED ORDER — HYDROCODONE-ACETAMINOPHEN 5-325 MG PO TABS
1.0000 | ORAL_TABLET | ORAL | Status: DC | PRN
  Administered 2019-02-21: 11:00:00 2 via ORAL

## 2019-02-21 SURGICAL SUPPLY — 45 items
BLADE CLIPPER SURG (BLADE) ×2 IMPLANT
BLADE SURG 10 STRL SS (BLADE) ×3 IMPLANT
BLADE SURG 15 STRL LF DISP TIS (BLADE) ×1 IMPLANT
BLADE SURG 15 STRL SS (BLADE) ×2
CANISTER SUCT 3000ML PPV (MISCELLANEOUS) ×3 IMPLANT
CHLORAPREP W/TINT 10.5 ML (MISCELLANEOUS) ×3 IMPLANT
CLIP VESOCCLUDE MED 24/CT (CLIP) ×3 IMPLANT
CLIP VESOCCLUDE SM WIDE 24/CT (CLIP) ×3 IMPLANT
CLOSURE WOUND 1/2 X4 (GAUZE/BANDAGES/DRESSINGS) ×1
COVER SURGICAL LIGHT HANDLE (MISCELLANEOUS) ×3 IMPLANT
COVER WAND RF STERILE (DRAPES) ×3 IMPLANT
DERMABOND ADHESIVE PROPEN (GAUZE/BANDAGES/DRESSINGS) ×2
DERMABOND ADVANCED .7 DNX6 (GAUZE/BANDAGES/DRESSINGS) IMPLANT
DRAPE LAPAROTOMY 100X72 PEDS (DRAPES) ×3 IMPLANT
DRAPE UTILITY XL STRL (DRAPES) ×3 IMPLANT
ELECT REM PT RETURN 9FT ADLT (ELECTROSURGICAL) ×3
ELECTRODE REM PT RTRN 9FT ADLT (ELECTROSURGICAL) ×1 IMPLANT
GAUZE 4X4 16PLY RFD (DISPOSABLE) ×5 IMPLANT
GAUZE SPONGE 4X4 12PLY STRL (GAUZE/BANDAGES/DRESSINGS) ×1 IMPLANT
GLOVE SURG ORTHO 8.0 STRL STRW (GLOVE) ×3 IMPLANT
GOWN STRL REUS W/ TWL LRG LVL3 (GOWN DISPOSABLE) ×1 IMPLANT
GOWN STRL REUS W/ TWL XL LVL3 (GOWN DISPOSABLE) ×1 IMPLANT
GOWN STRL REUS W/TWL LRG LVL3 (GOWN DISPOSABLE) ×2
GOWN STRL REUS W/TWL XL LVL3 (GOWN DISPOSABLE) ×4
HEMOSTAT ARISTA ABSORB 3G PWDR (HEMOSTASIS) IMPLANT
HEMOSTAT SURGICEL 2X4 FIBR (HEMOSTASIS) ×3 IMPLANT
ILLUMINATOR WAVEGUIDE N/F (MISCELLANEOUS) ×2 IMPLANT
KIT BASIN OR (CUSTOM PROCEDURE TRAY) ×3 IMPLANT
KIT TURNOVER KIT B (KITS) ×3 IMPLANT
NS IRRIG 1000ML POUR BTL (IV SOLUTION) ×3 IMPLANT
PACK SURGICAL SETUP 50X90 (CUSTOM PROCEDURE TRAY) ×3 IMPLANT
PAD ARMBOARD 7.5X6 YLW CONV (MISCELLANEOUS) ×5 IMPLANT
POSITIONER HEAD DONUT 9IN (MISCELLANEOUS) ×3 IMPLANT
SHEARS HARMONIC 9CM CVD (BLADE) ×3 IMPLANT
SPECIMEN JAR MEDIUM (MISCELLANEOUS) ×3 IMPLANT
SPONGE INTESTINAL PEANUT (DISPOSABLE) ×3 IMPLANT
STRIP CLOSURE SKIN 1/2X4 (GAUZE/BANDAGES/DRESSINGS) ×2 IMPLANT
SUT MNCRL AB 4-0 PS2 18 (SUTURE) ×3 IMPLANT
SUT SILK 2 0 (SUTURE) ×2
SUT SILK 2-0 18XBRD TIE 12 (SUTURE) ×1 IMPLANT
SUT VIC AB 3-0 SH 18 (SUTURE) ×6 IMPLANT
SYR BULB 3OZ (MISCELLANEOUS) ×3 IMPLANT
TOWEL GREEN STERILE FF (TOWEL DISPOSABLE) ×5 IMPLANT
TUBE CONNECTING 12'X1/4 (SUCTIONS) ×1
TUBE CONNECTING 12X1/4 (SUCTIONS) ×2 IMPLANT

## 2019-02-21 NOTE — Op Note (Signed)
Procedure Note  Pre-operative Diagnosis:  Multinodular goiter with compressive symptoms  Post-operative Diagnosis:  same  Surgeon:  Armandina Gemma, MD  Assistant:  Judyann Munson, RNFA   Procedure:  Total thyroidectomy  Anesthesia:  General  Estimated Blood Loss:  minimal  Drains: none         Specimen: thyroid to pathology  Indications:  Patient has a multinodular thyroid goiter with bilateral thyroid nodules. Her most recent ultrasound exam showed some changes in the character of bilateral nodules and fine-needle aspiration biopsies were recommended. However, the patient continues to have compressive symptoms and is interested in thyroidectomy. Prior to thyroid surgery we asked for an evaluation including a barium swallow and a speech pathology evaluation. Both of these were completed the spring. Both studies are within normal limits. No significant pathology was identified.  Patient continues to have mild dysphagia. She has problems with large pills. She feels compression in the upper neck. She is interested in proceeding with thyroidectomy to avoid further symptoms and to avoid the need for further ultrasound examinations and biopsies.  Procedure Details: Procedure was done in OR #1 at the [facility]. The patient was brought to the operating room and placed in a supine position on the operating room table. Following administration of general anesthesia, the patient was positioned and then prepped and draped in the usual aseptic fashion. After ascertaining that an adequate level of anesthesia had been achieved, a small Kocher incision was made with #15 blade. Dissection was carried through subcutaneous tissues and platysma.Hemostasis was achieved with the electrocautery. Skin flaps were elevated cephalad and caudad from the thyroid notch to the sternal notch. A Mahorner self-retaining retractor was placed for exposure. Strap muscles were incised in the midline and dissection  was begun on the left side.  Strap muscles were reflected laterally.  Left thyroid lobe was moderately enlarged and multinodular.  The left lobe was gently mobilized with blunt dissection. Superior pole vessels were dissected out and divided individually between small and medium ligaclips with the harmonic scalpel. The thyroid lobe was rolled anteriorly. Branches of the inferior thyroid artery were divided between small ligaclips with the harmonic scalpel. Inferior venous tributaries were divided between ligaclips. Both the superior and inferior parathyroid glands were identified and preserved on their vascular pedicles. The recurrent laryngeal nerve was identified and preserved along its course. The ligament of Gwenlyn Found was released with the electrocautery and the gland was mobilized onto the anterior trachea. Isthmus was mobilized across the midline. There was a moderate sized pyramidal lobe present which was dissected off the thyroid cartilage and resected with the isthmus. Dry pack was placed in the left neck.  The right thyroid lobe was gently mobilized with blunt dissection. Right thyroid lobe was moderately enlarged and multinodular. Superior pole vessels were dissected out and divided between small and medium ligaclips with the Harmonic scalpel. Superior parathyroid was identified and preserved. Inferior venous tributaries were divided between medium ligaclips with the harmonic scalpel. The right thyroid lobe was rolled anteriorly and the branches of the inferior thyroid artery divided between small ligaclips. The right recurrent laryngeal nerve was identified and preserved along its course. The ligament of Gwenlyn Found was released with the electrocautery. The right thyroid lobe was mobilized onto the anterior trachea and the remainder of the thyroid was dissected off the anterior trachea and the thyroid was completely excised. A suture was used to mark the left lobe. The entire thyroid gland was submitted to  pathology for review.  The  neck was irrigated with warm saline. Fibrillar was placed throughout the operative field. Strap muscles were approximated in the midline with interrupted 3-0 Vicryl sutures. Platysma was closed with interrupted 3-0 Vicryl sutures. Skin was closed with a running 4-0 Monocryl subcuticular suture. Wound was washed and Dermabond was applied. The patient was awakened from anesthesia and brought to the recovery room. The patient tolerated the procedure well.   Armandina Gemma, MD [facility] Office: [phone]

## 2019-02-21 NOTE — Anesthesia Postprocedure Evaluation (Signed)
Anesthesia Post Note  Patient: Karina Herman  Procedure(s) Performed: TOTAL THYROIDECTOMY (N/A Neck)     Patient location during evaluation: PACU Anesthesia Type: General Level of consciousness: awake and alert, oriented and patient cooperative Pain management: pain level controlled Vital Signs Assessment: post-procedure vital signs reviewed and stable Respiratory status: spontaneous breathing, nonlabored ventilation, respiratory function stable and patient connected to nasal cannula oxygen Cardiovascular status: blood pressure returned to baseline and stable Postop Assessment: no apparent nausea or vomiting Anesthetic complications: no    Last Vitals:  Vitals:   02/21/19 1323 02/21/19 1444  BP: 132/69 126/66  Pulse: 97 99  Resp: 14 15  Temp: 36.7 C 36.5 C  SpO2: 94% 95%    Last Pain:  Vitals:   02/21/19 1525  TempSrc:   PainSc: 7                  Amdrew Oboyle,E. Sumayah Bearse

## 2019-02-21 NOTE — Anesthesia Procedure Notes (Signed)
Procedure Name: Intubation Date/Time: 02/21/2019 7:36 AM Performed by: Imagene Riches, CRNA Pre-anesthesia Checklist: Patient identified, Emergency Drugs available, Suction available and Patient being monitored Patient Re-evaluated:Patient Re-evaluated prior to induction Oxygen Delivery Method: Circle System Utilized Preoxygenation: Pre-oxygenation with 100% oxygen Induction Type: IV induction Ventilation: Mask ventilation without difficulty Laryngoscope Size: Miller and 2 Grade View: Grade I Tube type: Oral Tube size: 7.0 mm Number of attempts: 1 Airway Equipment and Method: Stylet and Oral airway Placement Confirmation: ETT inserted through vocal cords under direct vision,  positive ETCO2 and breath sounds checked- equal and bilateral Secured at: 22 cm Tube secured with: Tape Dental Injury: Teeth and Oropharynx as per pre-operative assessment

## 2019-02-21 NOTE — Interval H&P Note (Signed)
History and Physical Interval Note:  02/21/2019 7:09 AM  Karina Herman  has presented today for surgery, with the diagnosis of MULTINODULAR THYROID GOITER WITH COMPRESSIVE SYMPTOMS.  The various methods of treatment have been discussed with the patient and family. After consideration of risks, benefits and other options for treatment, the patient has consented to    Procedure(s): TOTAL THYROIDECTOMY (N/A) as a surgical intervention.    The patient's history has been reviewed, patient examined, no change in status, stable for surgery.  I have reviewed the patient's chart and labs.  Questions were answered to the patient's satisfaction.    Armandina Gemma, [facility] Office: [place]

## 2019-02-21 NOTE — Transfer of Care (Signed)
Immediate Anesthesia Transfer of Care Note  Patient: Karina Herman  Procedure(s) Performed: TOTAL THYROIDECTOMY (N/A Neck)  Patient Location: PACU  Anesthesia Type:General  Level of Consciousness: drowsy  Airway & Oxygen Therapy: Patient Spontanous Breathing and Patient connected to face mask oxygen  Post-op Assessment: Report given to RN and Post -op Vital signs reviewed and stable  Post vital signs: Reviewed and stable  Last Vitals:  Vitals Value Taken Time  BP 146/46 02/21/19 0948  Temp    Pulse 102 02/21/19 0953  Resp 13 02/21/19 0953  SpO2 92 % 02/21/19 0953  Vitals shown include unvalidated device data.  Last Pain:  Vitals:   02/21/19 0642  TempSrc:   PainSc: 0-No pain      Patients Stated Pain Goal: 2 (53/29/92 4268)  Complications: No apparent anesthesia complications

## 2019-02-22 ENCOUNTER — Encounter (HOSPITAL_COMMUNITY): Payer: Self-pay | Admitting: Surgery

## 2019-02-22 DIAGNOSIS — E042 Nontoxic multinodular goiter: Secondary | ICD-10-CM | POA: Diagnosis not present

## 2019-02-22 LAB — BASIC METABOLIC PANEL
Anion gap: 13 (ref 5–15)
BUN: 15 mg/dL (ref 8–23)
CO2: 25 mmol/L (ref 22–32)
Calcium: 9.6 mg/dL (ref 8.9–10.3)
Chloride: 96 mmol/L — ABNORMAL LOW (ref 98–111)
Creatinine, Ser: 0.69 mg/dL (ref 0.44–1.00)
GFR calc Af Amer: >60 mL/min (ref >60)
GFR calc non Af Amer: >60 mL/min (ref >60)
Glucose, Bld: 173 mg/dL — ABNORMAL HIGH (ref 70–99)
Potassium: 4.3 mmol/L (ref 3.5–5.1)
Sodium: 134 mmol/L — ABNORMAL LOW (ref 135–145)

## 2019-02-22 MED ORDER — LEVOTHYROXINE SODIUM 88 MCG PO TABS: 88.0000 ug | ORAL_TABLET | Freq: Every day | ORAL | 3 refills | Status: DC

## 2019-02-22 MED ORDER — TRAMADOL HCL 50 MG PO TABS: 50.0000 mg | ORAL_TABLET | Freq: Four times a day (QID) | ORAL | 0 refills | Status: DC | PRN

## 2019-02-22 MED ORDER — CALCIUM CARBONATE ANTACID 500 MG PO CHEW: 2.0000 | CHEWABLE_TABLET | Freq: Two times a day (BID) | ORAL | 1 refills | Status: DC

## 2019-02-22 NOTE — Discharge Summary (Signed)
Physician Discharge Summary [facility]  Patient ID: Karina Herman MRN: [id number] DOB/AGE: September 13, 1950 68 y.o.  Admit date: 02/21/2019 Discharge date: 02/22/2019  Admission Diagnoses:  Multinodular thyroid goiter  Discharge Diagnoses:  Principal Problem:   Multinodular goiter (nontoxic)   Discharged Condition: good  Hospital Course: Patient was admitted for observation following thyroid surgery.  Post op course was uncomplicated.  Pain was well controlled.  Tolerated diet.  Post op calcium level on morning following surgery was 9.6 mg/dl.  Patient was prepared for discharge home on POD#1.  Consults: None  Treatments: surgery: total thyroidectomy  Discharge Exam: Blood pressure 132/61, pulse 100, temperature 98.5 F (36.9 C), temperature source Oral, resp. rate 18, height 5' 3.5" (1.613 m), weight 88.5 kg, SpO2 94 %. HEENT - clear Neck - wound dry and intact; Dermabond in place; voice slightly hoarse Chest - clear bilaterally Cor - RRR   Disposition: Home  Discharge Instructions    Diet - low sodium heart healthy   Complete by: As directed    Discharge instructions   Complete by: As directed    [facility]  THYROID & PARATHYROID SURGERY:  POST-OP INSTRUCTIONS  Always review your discharge instruction sheet from the facility where your surgery was performed.  A prescription for pain medication may be given to you upon discharge.  Take your pain medication as prescribed.  If narcotic pain medicine is not needed, then you may take acetaminophen (Tylenol) or ibuprofen (Advil) as needed.  Take your usually prescribed medications unless otherwise directed.  If you need a refill on your pain medication, please contact our office during regular business hours.  Prescriptions cannot be processed by our office after 5 pm or on weekends.  Start with a light diet upon arrival home, such as soup and crackers or toast.  Be sure to drink  plenty of fluids daily.  Resume your normal diet the day after surgery.  Most patients will experience some swelling and bruising on the chest and neck area.  Ice packs will help.  Swelling and bruising can take several days to resolve.   It is common to experience some constipation after surgery.  Increasing fluid intake and taking a stool softener (Colace) will usually help or prevent this problem.  A mild laxative (Milk of Magnesia or Miralax) should be taken according to package directions if there has been no bowel movement after 48 hours.  You have steri-strips and a gauze dressing over your incision.  You may remove the gauze bandage on the second day after surgery, and you may shower at that time.  Leave your steri-strips (small skin tapes) in place directly over the incision.  These strips should remain on the skin for 5-7 days and then be removed.  You may get them wet in the shower and pat them dry.  You may resume regular (light) daily activities beginning the next day (such as daily self-care, walking, climbing stairs) gradually increasing activities as tolerated.  You may have sexual intercourse when it is comfortable.  Refrain from any heavy lifting or straining until approved by your doctor.  You may drive when you no longer are taking prescription pain medication, you can comfortably wear a seatbelt, and you can safely maneuver your car and apply brakes.  You should see your doctor in the office for a follow-up appointment approximately three weeks after your surgery.  Make sure that you call for this appointment within a day or  two after you arrive home to insure a convenient appointment time.  WHEN TO CALL YOUR DOCTOR: -- Fever greater than 101.5 -- Inability to urinate -- Nausea and/or vomiting - persistent -- Extreme swelling or bruising -- Continued bleeding from incision -- Increased pain, redness, or drainage from the incision -- Difficulty swallowing or breathing --  Muscle cramping or spasms -- Numbness or tingling in hands or around lips  The clinic staff is available to answer your questions during regular business hours.  Please don't hesitate to call and ask to speak to one of the nurses if you have concerns.  Armandina Gemma, MD [facility] Office: [phone]   Ice pack   Complete by: As directed    Increase activity slowly   Complete by: As directed    No dressing needed   Complete by: As directed      Allergies as of 02/22/2019   No Known Allergies     Medication List    TAKE these medications   calcium carbonate 500 MG chewable tablet Commonly known as: Tums Chew 2 tablets (400 mg of elemental calcium total) by mouth 2 (two) times daily.   levothyroxine 88 MCG tablet Commonly known as: Synthroid Take 1 tablet (88 mcg total) by mouth daily before breakfast.   metFORMIN 1000 MG tablet Commonly known as: GLUCOPHAGE Take 1,000 mg by mouth 2 (two) times a day.   naproxen 500 MG tablet Commonly known as: NAPROSYN Take 500 mg by mouth 2 (two) times a day.   simvastatin 10 MG tablet Commonly known as: ZOCOR Take 10 mg by mouth at bedtime.   traMADol 50 MG tablet Commonly known as: ULTRAM Take 1-2 tablets (50-100 mg total) by mouth every 6 (six) hours as needed for moderate pain or severe pain.        Earnstine Regal, MD, [facility] Office: [phone]   Signed: Armandina Gemma 02/22/2019, 8:10 AM

## 2019-02-23 NOTE — Progress Notes (Signed)
Please contact patient and notify of benign pathology results.  Timiko Offutt M. Mekaela Azizi, MD, FACS [facility] Office: [phone]   

## 2019-10-02 ENCOUNTER — Encounter (HOSPITAL_BASED_OUTPATIENT_CLINIC_OR_DEPARTMENT_OTHER): Payer: Self-pay

## 2019-10-02 ENCOUNTER — Emergency Department (HOSPITAL_BASED_OUTPATIENT_CLINIC_OR_DEPARTMENT_OTHER)
Admission: EM | Admit: 2019-10-02 | Discharge: 2019-10-02 | Disposition: A | Discharge status: Home / Self Care | Payer: Medicare PPO | Attending: Emergency Medicine | Admitting: Emergency Medicine

## 2019-10-02 ENCOUNTER — Other Ambulatory Visit: Payer: Self-pay

## 2019-10-02 ENCOUNTER — Emergency Department (HOSPITAL_BASED_OUTPATIENT_CLINIC_OR_DEPARTMENT_OTHER): Payer: Medicare PPO

## 2019-10-02 DIAGNOSIS — Y92000 Kitchen of unspecified non-institutional (private) residence as  the place of occurrence of the external cause: Secondary | ICD-10-CM | POA: Insufficient documentation

## 2019-10-02 DIAGNOSIS — E782 Mixed hyperlipidemia: Secondary | ICD-10-CM | POA: Diagnosis not present

## 2019-10-02 DIAGNOSIS — Z79899 Other long term (current) drug therapy: Secondary | ICD-10-CM | POA: Insufficient documentation

## 2019-10-02 DIAGNOSIS — Z7984 Long term (current) use of oral hypoglycemic drugs: Secondary | ICD-10-CM | POA: Insufficient documentation

## 2019-10-02 DIAGNOSIS — Y939 Activity, unspecified: Secondary | ICD-10-CM | POA: Insufficient documentation

## 2019-10-02 DIAGNOSIS — Z87891 Personal history of nicotine dependence: Secondary | ICD-10-CM | POA: Diagnosis not present

## 2019-10-02 DIAGNOSIS — E119 Type 2 diabetes mellitus without complications: Secondary | ICD-10-CM | POA: Diagnosis not present

## 2019-10-02 DIAGNOSIS — W260XXA Contact with knife, initial encounter: Secondary | ICD-10-CM | POA: Insufficient documentation

## 2019-10-02 DIAGNOSIS — Y999 Unspecified external cause status: Secondary | ICD-10-CM | POA: Diagnosis not present

## 2019-10-02 DIAGNOSIS — S61217A Laceration without foreign body of left little finger without damage to nail, initial encounter: Secondary | ICD-10-CM | POA: Insufficient documentation

## 2019-10-02 DIAGNOSIS — R2242 Localized swelling, mass and lump, left lower limb: Secondary | ICD-10-CM | POA: Insufficient documentation

## 2019-10-02 MED ORDER — NAPROXEN 375 MG PO TABS: 375.0000 mg | ORAL_TABLET | Freq: Two times a day (BID) | ORAL | 0 refills | Status: DC

## 2019-10-02 NOTE — ED Provider Notes (Signed)
[facility] EMERGENCY DEPARTMENT Provider Note   CSN: [id number] Arrival date & time: 10/02/19  1341     History Chief Complaint  Patient presents with  . Extremity Laceration  . Leg Swelling    Karina Herman is a 69 y.o. female.  69 year old female with past medical history including type 2 diabetes mellitus, hyperlipidemia, psoriasis who presents with finger laceration and left foot swelling.  Approximately 1 PM today, patient was using a knife in the kitchen and accidentally cut the palmar surface of her left fifth fingertip.  Bleeding is currently controlled.  She is right-handed.  Tetanus up-to-date.  Regarding the left foot swelling, she states that in the past, she had an episode of foot swelling and pain began in her L great toe MTP joint. Pain spread to foot, eventually got better on its own. She thought it may have been gout. 3 days ago, and having pain in her left ankle that traveled down into her foot and she has had associated swelling.  Initially it was very painful even with that she touching her foot.  The pain has improved.  She has noted some mild swelling of her lower leg and bottom of calf. No h/o clots, recent travel, estrogen use, chest pain, SOB, DOE, or other complaints.  The history is provided by the patient.       Past Medical History:  Diagnosis Date  . Arthritis   . Diabetes mellitus without complication ([place])   . High cholesterol     Patient Active Problem List   Diagnosis Date Noted  . Multinodular goiter (nontoxic) 02/17/2019    Past Surgical History:  Procedure Laterality Date  . APPENDECTOMY    . OVARIAN CYST REMOVAL    . THYROIDECTOMY N/A 02/21/2019   Procedure: TOTAL THYROIDECTOMY;  Surgeon: Armandina Gemma, MD;  Location: [facility];  Service: General;  Laterality: N/A;     OB History   No obstetric history on file.     No family history on file.  Social History   Tobacco Use  . Smoking status: Former Research scientist (life sciences)  .  Smokeless tobacco: Never Used  Substance Use Topics  . Alcohol use: Not Currently    Comment: rarely  . Drug use: Never    Home Medications Prior to Admission medications   Medication Sig Start Date End Date Taking? Authorizing Provider  calcium carbonate (TUMS) 500 MG chewable tablet Chew 2 tablets (400 mg of elemental calcium total) by mouth 2 (two) times daily. 02/22/19   Armandina Gemma, MD  levothyroxine (SYNTHROID) 88 MCG tablet Take 1 tablet (88 mcg total) by mouth daily before breakfast. 02/22/19   Armandina Gemma, MD  metFORMIN (GLUCOPHAGE) 1000 MG tablet Take 1,000 mg by mouth 2 (two) times a day. 08/19/18   [provider]  naproxen (NAPROSYN) 500 MG tablet Take 500 mg by mouth 2 (two) times a day.    [provider]  simvastatin (ZOCOR) 10 MG tablet Take 10 mg by mouth at bedtime.    [provider]  traMADol (ULTRAM) 50 MG tablet Take 1-2 tablets (50-100 mg total) by mouth every 6 (six) hours as needed for moderate pain or severe pain. 02/22/19   Armandina Gemma, MD    Allergies    Patient has no known allergies.  Review of Systems   Review of Systems All other systems reviewed and are negative except that which was mentioned in HPI  Physical Exam Updated Vital Signs BP (!) 147/72 (BP Location: Right  Arm)   Pulse 81   Temp 98.4 F (36.9 C) (Oral)   Resp 16   Ht 5\' 3"  (1.6 m)   Wt 86.2 kg   SpO2 98%   BMI 33.66 kg/m   Physical Exam Vitals and nursing note reviewed.  Constitutional:      General: She is not in acute distress.    Appearance: She is well-developed.  HENT:     Head: Normocephalic and atraumatic.  Eyes:     Conjunctiva/sclera: Conjunctivae normal.  Cardiovascular:     Rate and Rhythm: Normal rate and regular rhythm.     Pulses: Normal pulses.     Heart sounds: Normal heart sounds. No murmur.  Pulmonary:     Effort: Pulmonary effort is normal.     Breath sounds: Normal breath sounds.  Abdominal:     General: Bowel sounds are  normal. There is no distension.     Palpations: Abdomen is soft.     Tenderness: There is no abdominal tenderness.  Musculoskeletal:        General: Swelling present.     Cervical back: Neck supple.     Comments: Edema of dorsal L foot and ankle without warmth or redness; some edema of lower leg compared to R, no focal calf tenderness.  Skin:    General: Skin is warm and dry.     Findings: No erythema or rash.     Comments: 0.5cm superficial laceration to palmar surface of L 5th fingertip, normal ROM at DIP/PIP joints  Neurological:     Mental Status: She is alert and oriented to person, place, and time.     Comments: Fluent speech  Psychiatric:        Judgment: Judgment normal.     ED Results / Procedures / Treatments   Labs (all labs ordered are listed, but only abnormal results are displayed) Labs Reviewed - No data to display  EKG None  Radiology DG Ankle Complete Left  Result Date: 10/02/2019 CLINICAL DATA:  Atraumatic left foot and ankle swelling for 3 days, history of psoriasis EXAM: LEFT ANKLE COMPLETE - 3+ VIEW COMPARISON:  Ankle radiograph 10/02/2019 FINDINGS: No acute bony abnormality. Specifically, no fracture, subluxation, or dislocation. Mild arthrosis about the ankle joint with some spurring noted at the tip of the lateral malleolus possibly degenerative/enthesopathic in nature. No suspicious erosion or destructive changes. Mild thickening of the Achilles tendon is noted. No sizable effusion or other acute soft tissue abnormality. IMPRESSION: No acute bony abnormality. Mild arthrosis about the ankle joint with likely enthesopathic changes along the lateral malleolus. Nonspecific thickening of the Achilles tendon, correlate with point tenderness. Electronically Signed   By: Lovena Le M.D.   On: 10/02/2019 16:48   US Venous Img Lower  Left (DVT Study)  Result Date: 10/02/2019 CLINICAL DATA:  Left lower extremity swelling EXAM: LEFT LOWER EXTREMITY VENOUS DOPPLER  ULTRASOUND TECHNIQUE: Gray-scale sonography with compression, as well as color and duplex ultrasound, were performed to evaluate the deep venous system(s) from the level of the common femoral vein through the popliteal and proximal calf veins. COMPARISON:  None. FINDINGS: VENOUS Normal compressibility of the common femoral, superficial femoral, and popliteal veins, as well as the visualized calf veins. Visualized portions of profunda femoral vein and great saphenous vein unremarkable. No filling defects to suggest DVT on grayscale or color Doppler imaging. Doppler waveforms show normal direction of venous flow, normal respiratory phasicity and response to augmentation. Limited views of the contralateral common femoral vein  are unremarkable. OTHER None. Limitations: none IMPRESSION: No femoropopliteal DVT nor evidence of DVT within the visualized calf veins. Electronically Signed   By: Macy Mis M.D.   On: 10/02/2019 18:34   DG Foot Complete Left  Result Date: 10/02/2019 CLINICAL DATA:  Acute left foot pain without known injury. EXAM: LEFT FOOT - COMPLETE 3+ VIEW COMPARISON:  None. FINDINGS: There is no evidence of fracture or dislocation. There is no evidence of arthropathy or other focal bone abnormality. Soft tissues are unremarkable. IMPRESSION: Negative. Electronically Signed   By: Marijo Conception M.D.   On: 10/02/2019 16:47    Procedures .Marland KitchenLaceration Repair  Date/Time: 10/02/2019 4:08 PM Performed by: Sharlett Iles, MD Authorized by: Sharlett Iles, MD   Consent:    Consent obtained:  Verbal   Consent given by:  Patient   Risks discussed:  Infection, pain and poor wound healing   Alternatives discussed:  No treatment Anesthesia (see MAR for exact dosages):    Anesthesia method:  None Laceration details:    Location:  Finger   Finger location:  L small finger   Length (cm):  0.5 Repair type:    Repair type:  Simple Treatment:    Area cleansed with:  Saline   Amount  of cleaning:  Standard   Irrigation solution:  Sterile saline   Irrigation method:  Pressure wash Skin repair:    Repair method:  Steri-Strips   Number of Steri-Strips:  5 Approximation:    Approximation:  Close Post-procedure details:    Dressing:  Open (no dressing)   Patient tolerance of procedure:  Tolerated well, no immediate complications   (including critical care time)  Medications Ordered in ED Medications - No data to display  ED Course  I have reviewed the triage vital signs and the nursing notes.  Pertinent imaging results that were available during my care of the patient were reviewed by me and considered in my medical decision making (see chart for details).    MDM Rules/Calculators/A&P                        1) finger laceration: discussed options of sutures, healing secondarily, or steri strips. Because mildly gaping, didn't recommend glue. Pt opted for steri strips which I secured w/ benzoin. Discussed wound care.  2) L foot/ankle/lower leg swelling: no hx trauma. ddx includes gout, pseudogout, psoriatic arthritis, DVT.  Ultrasound negative for DVT.  Plain films of foot and ankle negative acute.  Based on her history of similar previous episode, I do suspect gout or pseudogout.  She has tried naproxen at home with some relief, I feel she can continue short course of this but have cautioned not to use for more than 3 to 4 days.  Discussed supportive measures and PCP follow-up.  Return precautions reviewed. Final Clinical Impression(s) / ED Diagnoses Final diagnoses:  None    Rx / DC Orders ED Discharge Orders    None       Layliana Devins, Wenda Overland, MD 10/02/19 1925

## 2019-10-02 NOTE — ED Triage Notes (Signed)
Pt arrives to ED with laceration to left hand today from knife while preparing lunch. Pt appears to have unsteady gait in triage, when asked about this patient reports she has had some swelling to both legs for the past few days and been off balance since.

## 2019-12-07 DIAGNOSIS — R195 Other fecal abnormalities: Secondary | ICD-10-CM | POA: Diagnosis not present

## 2019-12-18 DIAGNOSIS — E7800 Pure hypercholesterolemia, unspecified: Secondary | ICD-10-CM | POA: Diagnosis not present

## 2019-12-18 DIAGNOSIS — E039 Hypothyroidism, unspecified: Secondary | ICD-10-CM | POA: Diagnosis not present

## 2019-12-18 DIAGNOSIS — Z7984 Long term (current) use of oral hypoglycemic drugs: Secondary | ICD-10-CM | POA: Diagnosis not present

## 2019-12-18 DIAGNOSIS — K219 Gastro-esophageal reflux disease without esophagitis: Secondary | ICD-10-CM | POA: Diagnosis not present

## 2019-12-18 DIAGNOSIS — Z0000 Encounter for general adult medical examination without abnormal findings: Secondary | ICD-10-CM | POA: Diagnosis not present

## 2019-12-18 DIAGNOSIS — E119 Type 2 diabetes mellitus without complications: Secondary | ICD-10-CM | POA: Diagnosis not present

## 2019-12-18 DIAGNOSIS — I10 Essential (primary) hypertension: Secondary | ICD-10-CM | POA: Diagnosis not present

## 2020-01-26 DIAGNOSIS — Z03818 Encounter for observation for suspected exposure to other biological agents ruled out: Secondary | ICD-10-CM | POA: Diagnosis not present

## 2020-01-31 DIAGNOSIS — K640 First degree hemorrhoids: Secondary | ICD-10-CM | POA: Diagnosis not present

## 2020-01-31 DIAGNOSIS — D122 Benign neoplasm of ascending colon: Secondary | ICD-10-CM | POA: Diagnosis not present

## 2020-01-31 DIAGNOSIS — D120 Benign neoplasm of cecum: Secondary | ICD-10-CM | POA: Diagnosis not present

## 2020-01-31 DIAGNOSIS — R195 Other fecal abnormalities: Secondary | ICD-10-CM | POA: Diagnosis not present

## 2020-01-31 DIAGNOSIS — K5730 Diverticulosis of large intestine without perforation or abscess without bleeding: Secondary | ICD-10-CM | POA: Diagnosis not present

## 2020-02-02 DIAGNOSIS — D120 Benign neoplasm of cecum: Secondary | ICD-10-CM | POA: Diagnosis not present

## 2020-02-02 DIAGNOSIS — D122 Benign neoplasm of ascending colon: Secondary | ICD-10-CM | POA: Diagnosis not present

## 2020-06-21 DIAGNOSIS — Z7984 Long term (current) use of oral hypoglycemic drugs: Secondary | ICD-10-CM | POA: Diagnosis not present

## 2020-06-21 DIAGNOSIS — E119 Type 2 diabetes mellitus without complications: Secondary | ICD-10-CM | POA: Diagnosis not present

## 2020-06-21 DIAGNOSIS — K219 Gastro-esophageal reflux disease without esophagitis: Secondary | ICD-10-CM | POA: Diagnosis not present

## 2020-06-21 DIAGNOSIS — E039 Hypothyroidism, unspecified: Secondary | ICD-10-CM | POA: Diagnosis not present

## 2020-06-21 DIAGNOSIS — R300 Dysuria: Secondary | ICD-10-CM | POA: Diagnosis not present

## 2020-06-21 DIAGNOSIS — I10 Essential (primary) hypertension: Secondary | ICD-10-CM | POA: Diagnosis not present

## 2020-06-21 DIAGNOSIS — E7800 Pure hypercholesterolemia, unspecified: Secondary | ICD-10-CM | POA: Diagnosis not present

## 2020-08-09 ENCOUNTER — Other Ambulatory Visit: Payer: Self-pay | Admitting: Nurse Practitioner

## 2020-08-09 DIAGNOSIS — E890 Postprocedural hypothyroidism: Secondary | ICD-10-CM | POA: Diagnosis not present

## 2020-08-09 DIAGNOSIS — Z9889 Other specified postprocedural states: Secondary | ICD-10-CM | POA: Diagnosis not present

## 2020-08-09 DIAGNOSIS — Z1231 Encounter for screening mammogram for malignant neoplasm of breast: Secondary | ICD-10-CM

## 2020-08-23 ENCOUNTER — Other Ambulatory Visit: Payer: Self-pay

## 2020-08-23 ENCOUNTER — Ambulatory Visit
Admission: RE | Admit: 2020-08-23 | Discharge: 2020-08-23 | Disposition: A | Discharge status: Home / Self Care | Payer: Medicare PPO | Source: Ambulatory Visit | Attending: Nurse Practitioner | Admitting: Nurse Practitioner

## 2020-08-23 DIAGNOSIS — Z1231 Encounter for screening mammogram for malignant neoplasm of breast: Secondary | ICD-10-CM | POA: Diagnosis not present

## 2020-09-24 DIAGNOSIS — H02423 Myogenic ptosis of bilateral eyelids: Secondary | ICD-10-CM | POA: Diagnosis not present

## 2020-09-24 DIAGNOSIS — H02834 Dermatochalasis of left upper eyelid: Secondary | ICD-10-CM | POA: Diagnosis not present

## 2020-09-24 DIAGNOSIS — H02831 Dermatochalasis of right upper eyelid: Secondary | ICD-10-CM | POA: Diagnosis not present

## 2020-09-24 DIAGNOSIS — H57813 Brow ptosis, bilateral: Secondary | ICD-10-CM | POA: Diagnosis not present

## 2021-01-17 DIAGNOSIS — K219 Gastro-esophageal reflux disease without esophagitis: Secondary | ICD-10-CM | POA: Diagnosis not present

## 2021-01-17 DIAGNOSIS — Z0000 Encounter for general adult medical examination without abnormal findings: Secondary | ICD-10-CM | POA: Diagnosis not present

## 2021-01-17 DIAGNOSIS — Z7984 Long term (current) use of oral hypoglycemic drugs: Secondary | ICD-10-CM | POA: Diagnosis not present

## 2021-01-17 DIAGNOSIS — E890 Postprocedural hypothyroidism: Secondary | ICD-10-CM | POA: Diagnosis not present

## 2021-01-17 DIAGNOSIS — E7800 Pure hypercholesterolemia, unspecified: Secondary | ICD-10-CM | POA: Diagnosis not present

## 2021-01-17 DIAGNOSIS — E119 Type 2 diabetes mellitus without complications: Secondary | ICD-10-CM | POA: Diagnosis not present

## 2021-01-17 DIAGNOSIS — I10 Essential (primary) hypertension: Secondary | ICD-10-CM | POA: Diagnosis not present

## 2021-04-25 DIAGNOSIS — E7800 Pure hypercholesterolemia, unspecified: Secondary | ICD-10-CM | POA: Diagnosis not present

## 2021-04-25 DIAGNOSIS — Z7984 Long term (current) use of oral hypoglycemic drugs: Secondary | ICD-10-CM | POA: Diagnosis not present

## 2021-04-25 DIAGNOSIS — I10 Essential (primary) hypertension: Secondary | ICD-10-CM | POA: Diagnosis not present

## 2021-04-25 DIAGNOSIS — R229 Localized swelling, mass and lump, unspecified: Secondary | ICD-10-CM | POA: Diagnosis not present

## 2021-04-25 DIAGNOSIS — E119 Type 2 diabetes mellitus without complications: Secondary | ICD-10-CM | POA: Diagnosis not present

## 2021-05-21 DIAGNOSIS — R229 Localized swelling, mass and lump, unspecified: Secondary | ICD-10-CM | POA: Diagnosis not present

## 2021-05-22 DIAGNOSIS — E119 Type 2 diabetes mellitus without complications: Secondary | ICD-10-CM | POA: Diagnosis not present

## 2021-06-12 ENCOUNTER — Ambulatory Visit: Payer: Self-pay | Admitting: Surgery

## 2021-06-12 DIAGNOSIS — M7989 Other specified soft tissue disorders: Secondary | ICD-10-CM | POA: Diagnosis not present

## 2021-08-17 ENCOUNTER — Encounter (HOSPITAL_BASED_OUTPATIENT_CLINIC_OR_DEPARTMENT_OTHER): Payer: Self-pay | Admitting: Surgery

## 2021-08-17 DIAGNOSIS — M7989 Other specified soft tissue disorders: Secondary | ICD-10-CM | POA: Diagnosis present

## 2021-08-17 NOTE — H&P (Signed)
REFERRING PHYSICIAN: Severiano Gilbert, *  PROVIDER: Amariya Liskey Charlotta Newton, MD  Chief Complaint: New Consultation (Soft tissue mass left upper arm)   History of Present Illness:  Patient presents on referral from her primary care provider for surgical evaluation and management of an enlarging soft tissue mass on the left upper arm. Patient states this has been present for 5 to 6 years. It is gradually increased in size. She has begun to experience some discomfort. She does not remember any history of trauma. There has been no drainage. She has no other such lesions on her extremities or her torso. Patient is known to my practice from previous thyroid surgery.  Review of Systems: A complete review of systems was obtained from the patient. I have reviewed this information and discussed as appropriate with the patient. See HPI as well for other ROS.  Review of Systems  Constitutional: Negative.  HENT: Negative.  Eyes: Negative.  Respiratory: Negative.  Cardiovascular: Negative.  Gastrointestinal: Negative.  Genitourinary: Negative.  Musculoskeletal: Negative.  Skin: Negative.  Soft tissue mass left upper arm  Neurological: Negative.  Endo/Heme/Allergies: Negative.  Psychiatric/Behavioral: Negative.    Medical History: History reviewed. No pertinent past medical history.  Patient Active Problem List  Diagnosis   Multinodular goiter (nontoxic)   Soft tissue mass   Past Surgical History:  Procedure Laterality Date   THYROIDECTOMY TOTAL 2020    No Known Allergies  Current Outpatient Medications on File Prior to Visit  Medication Sig Dispense Refill   levothyroxine 100 mcg Cap daily   metFORMIN (GLUCOPHAGE) 1000 MG tablet metformin 1,000 mg tablet Take 1 tablet twice a day by oral route.   simvastatin (ZOCOR) 10 MG tablet daily   No current facility-administered medications on file prior to visit.   History reviewed. No pertinent family history.   Social  History   Tobacco Use  Smoking Status Former   Types: Cigarettes   Quit date: 1993   Years since quitting: 29.8  Smokeless Tobacco Never    Social History   Socioeconomic History   Marital status: Married  Tobacco Use   Smoking status: Former  Types: Cigarettes  Quit date: 1993  Years since quitting: 29.8   Smokeless tobacco: Never  Vaping Use   Vaping Use: Never used  Substance and Sexual Activity   Alcohol use: Not Currently   Drug use: Never   Objective:   Vitals:  BP: (!) 180/60  Pulse: (!) 111  Temp: 36.2 C (97.2 F)  SpO2: 95%  Weight: 80 kg (176 lb 6.4 oz)  Height: 161.3 cm (5' 3.5")   Body mass index is 30.76 kg/m.  Physical Exam   GENERAL APPEARANCE Development: normal Nutritional status: normal Gross deformities: none  SKIN Rash, lesions, ulcers: none Induration, erythema: none Nodules: Soft tissue mass anterolateral left upper arm, 4 x 2 x 2 cm, firm, lobulated, with violaceous discoloration of the overlying skin  EYES Conjunctiva and lids: normal Pupils: equal and reactive Iris: normal bilaterally  EARS, NOSE, MOUTH, THROAT External ears: no lesion or deformity External nose: no lesion or deformity Hearing: grossly normal Due to Covid-19 pandemic, patient is wearing a mask.  NECK Symmetric: yes Trachea: midline Thyroid: no palpable nodules in the thyroid bed; well-healed anterior cervical incision  CHEST Respiratory effort: normal Retraction or accessory muscle use: no Breath sounds: normal bilaterally Rales, rhonchi, wheeze: none  CARDIOVASCULAR Auscultation: regular rhythm, normal rate Murmurs: none Pulses: radial pulse 2+ palpable Lower extremity edema: none  MUSCULOSKELETAL Station and gait: normal Digits and nails: no clubbing or cyanosis Muscle strength: grossly normal all extremities Range of motion: grossly normal all extremities Deformity: none  LYMPHATIC Cervical: none palpable Supraclavicular: none  palpable  PSYCHIATRIC Oriented to person, place, and time: yes Mood and affect: normal for situation Judgment and insight: appropriate for situation  Assessment and Plan:   Soft tissue mass   Patient is known to my practice from prior thyroid surgery. She continues to do well.  Patient has a soft tissue mass on the anterior lateral left upper arm which has gradually been enlarging over the past 5 to 6 years. It is now causing minor discomfort. She would like to have this excised for definitive diagnosis and management.  We discussed doing this as an outpatient surgical procedure. Patient would like to wait and do this in January 2023. We discussed sending this to pathology for review. We discussed potential diagnoses. Patient understands and wishes to proceed.  The risks and benefits of the procedure have been discussed at length with the patient. The patient understands the proposed procedure, potential alternative treatments, and the course of recovery to be expected. All of the patient's questions have been answered at this time. The patient wishes to proceed with surgery.  Armandina Gemma, MD [facility] [place] [facility] Office: [phone]

## 2021-08-20 DIAGNOSIS — H04123 Dry eye syndrome of bilateral lacrimal glands: Secondary | ICD-10-CM | POA: Diagnosis not present

## 2021-08-20 DIAGNOSIS — H02831 Dermatochalasis of right upper eyelid: Secondary | ICD-10-CM | POA: Diagnosis not present

## 2021-08-20 DIAGNOSIS — E119 Type 2 diabetes mellitus without complications: Secondary | ICD-10-CM | POA: Diagnosis not present

## 2021-08-20 DIAGNOSIS — H02834 Dermatochalasis of left upper eyelid: Secondary | ICD-10-CM | POA: Diagnosis not present

## 2021-08-20 DIAGNOSIS — H2513 Age-related nuclear cataract, bilateral: Secondary | ICD-10-CM | POA: Diagnosis not present

## 2021-08-20 DIAGNOSIS — H353131 Nonexudative age-related macular degeneration, bilateral, early dry stage: Secondary | ICD-10-CM | POA: Diagnosis not present

## 2021-08-20 DIAGNOSIS — H35371 Puckering of macula, right eye: Secondary | ICD-10-CM | POA: Diagnosis not present

## 2021-08-25 ENCOUNTER — Other Ambulatory Visit: Payer: Self-pay

## 2021-08-25 ENCOUNTER — Encounter (HOSPITAL_BASED_OUTPATIENT_CLINIC_OR_DEPARTMENT_OTHER): Payer: Self-pay | Admitting: Surgery

## 2021-08-25 DIAGNOSIS — R2232 Localized swelling, mass and lump, left upper limb: Secondary | ICD-10-CM

## 2021-08-25 HISTORY — DX: Localized swelling, mass and lump, left upper limb: R22.32

## 2021-08-25 NOTE — Progress Notes (Signed)
Spoke w/ via phone for pre-op interview---pt Lab needs dos----I stat, ekg              Lab results------none COVID test -----patient states asymptomatic no test needed Arrive at -------630 am 08-28-2021 NPO after MN NO Solid Food.  Clear liquids from MN until---530 am Med rec completed Medications to take morning of surgery -----Levothyroxine Diabetic medication -----none day of surgery Patient instructed no nail polish to be worn day of surgery Patient instructed to bring photo id and insurance card day of surgery Patient aware to have Driver (ride ) / caregiver    for 24 hours after surgery  husband larry Patient Special Instructions -----none Pre-Op special Istructions -----none Patient verbalized understanding of instructions that were given at this phone interview. Patient denies shortness of breath, chest pain, fever, cough at this phone interview.

## 2021-08-27 NOTE — Anesthesia Preprocedure Evaluation (Addendum)
Anesthesia Evaluation  Patient identified by MRN, date of birth, ID band Patient awake    Reviewed: Allergy & Precautions, NPO status , Patient's Chart, lab work & pertinent test results  Airway Mallampati: II  TM Distance: >3 FB Neck ROM: Full    Dental  (+) Teeth Intact, Dental Advisory Given Upper crowns:   Pulmonary former smoker,  Quit smoking 1993   Pulmonary exam normal breath sounds clear to auscultation       Cardiovascular negative cardio ROS Normal cardiovascular exam Rhythm:Regular Rate:Normal     Neuro/Psych negative neurological ROS  negative psych ROS   GI/Hepatic Neg liver ROS, GERD  Controlled,  Endo/Other  diabetes, Poorly Controlled, Type 2, Oral Hypoglycemic AgentsHypothyroidism a1c 8.4  Renal/GU negative Renal ROS  negative genitourinary   Musculoskeletal  (+) Arthritis , Osteoarthritis,    Abdominal   Peds  Hematology negative hematology ROS (+)   Anesthesia Other Findings LUE soft tissue mass  Reproductive/Obstetrics negative OB ROS                            Anesthesia Physical Anesthesia Plan  ASA: 2  Anesthesia Plan: MAC   Post-op Pain Management: Tylenol PO (pre-op) and Toradol IV (intra-op)   Induction:   PONV Risk Score and Plan: 2 and Propofol infusion and TIVA  Airway Management Planned: Natural Airway and Simple Face Mask  Additional Equipment: None  Intra-op Plan:   Post-operative Plan:   Informed Consent: I have reviewed the patients History and Physical, chart, labs and discussed the procedure including the risks, benefits and alternatives for the proposed anesthesia with the patient or authorized representative who has indicated his/her understanding and acceptance.     Dental advisory given  Plan Discussed with:   Anesthesia Plan Comments:        Anesthesia Quick Evaluation

## 2021-08-28 ENCOUNTER — Other Ambulatory Visit: Payer: Self-pay

## 2021-08-28 ENCOUNTER — Emergency Department (HOSPITAL_COMMUNITY): Payer: Medicare PPO

## 2021-08-28 ENCOUNTER — Encounter (HOSPITAL_COMMUNITY): Payer: Self-pay

## 2021-08-28 ENCOUNTER — Ambulatory Visit (HOSPITAL_BASED_OUTPATIENT_CLINIC_OR_DEPARTMENT_OTHER): Payer: Medicare PPO | Admitting: Anesthesiology

## 2021-08-28 ENCOUNTER — Encounter (HOSPITAL_BASED_OUTPATIENT_CLINIC_OR_DEPARTMENT_OTHER)
Admission: RE | Disposition: A | Discharge status: Still In Hospital | Payer: Self-pay | Source: Home / Self Care | Attending: Surgery

## 2021-08-28 ENCOUNTER — Encounter (HOSPITAL_BASED_OUTPATIENT_CLINIC_OR_DEPARTMENT_OTHER): Payer: Self-pay | Admitting: Surgery

## 2021-08-28 ENCOUNTER — Ambulatory Visit (HOSPITAL_BASED_OUTPATIENT_CLINIC_OR_DEPARTMENT_OTHER)
Admission: RE | Admit: 2021-08-28 | Discharge: 2021-08-28 | Disposition: A | Discharge status: Still In Hospital | Payer: Medicare PPO | Source: Home / Self Care | Attending: Surgery | Admitting: Surgery

## 2021-08-28 ENCOUNTER — Observation Stay (HOSPITAL_COMMUNITY)
Admission: EM | Admit: 2021-08-28 | Discharge: 2021-08-29 | Disposition: A | Discharge status: Home / Self Care | Payer: Medicare PPO | Attending: Internal Medicine | Admitting: Internal Medicine

## 2021-08-28 DIAGNOSIS — Z79899 Other long term (current) drug therapy: Secondary | ICD-10-CM | POA: Diagnosis not present

## 2021-08-28 DIAGNOSIS — E119 Type 2 diabetes mellitus without complications: Secondary | ICD-10-CM | POA: Insufficient documentation

## 2021-08-28 DIAGNOSIS — D1801 Hemangioma of skin and subcutaneous tissue: Secondary | ICD-10-CM | POA: Diagnosis not present

## 2021-08-28 DIAGNOSIS — Z87891 Personal history of nicotine dependence: Secondary | ICD-10-CM | POA: Diagnosis not present

## 2021-08-28 DIAGNOSIS — K219 Gastro-esophageal reflux disease without esophagitis: Secondary | ICD-10-CM | POA: Insufficient documentation

## 2021-08-28 DIAGNOSIS — Z7984 Long term (current) use of oral hypoglycemic drugs: Secondary | ICD-10-CM | POA: Insufficient documentation

## 2021-08-28 DIAGNOSIS — E1165 Type 2 diabetes mellitus with hyperglycemia: Secondary | ICD-10-CM | POA: Diagnosis not present

## 2021-08-28 DIAGNOSIS — M7989 Other specified soft tissue disorders: Secondary | ICD-10-CM | POA: Diagnosis present

## 2021-08-28 DIAGNOSIS — I4719 Other supraventricular tachycardia: Secondary | ICD-10-CM

## 2021-08-28 DIAGNOSIS — R000 Tachycardia, unspecified: Secondary | ICD-10-CM | POA: Diagnosis not present

## 2021-08-28 DIAGNOSIS — R2232 Localized swelling, mass and lump, left upper limb: Secondary | ICD-10-CM | POA: Diagnosis not present

## 2021-08-28 DIAGNOSIS — Z79890 Hormone replacement therapy (postmenopausal): Secondary | ICD-10-CM | POA: Insufficient documentation

## 2021-08-28 DIAGNOSIS — E039 Hypothyroidism, unspecified: Secondary | ICD-10-CM | POA: Insufficient documentation

## 2021-08-28 DIAGNOSIS — D1809 Hemangioma of other sites: Secondary | ICD-10-CM | POA: Insufficient documentation

## 2021-08-28 DIAGNOSIS — Z20822 Contact with and (suspected) exposure to covid-19: Secondary | ICD-10-CM | POA: Diagnosis not present

## 2021-08-28 DIAGNOSIS — R079 Chest pain, unspecified: Secondary | ICD-10-CM | POA: Diagnosis not present

## 2021-08-28 DIAGNOSIS — I471 Supraventricular tachycardia: Secondary | ICD-10-CM | POA: Insufficient documentation

## 2021-08-28 DIAGNOSIS — M1990 Unspecified osteoarthritis, unspecified site: Secondary | ICD-10-CM | POA: Insufficient documentation

## 2021-08-28 DIAGNOSIS — J9811 Atelectasis: Secondary | ICD-10-CM | POA: Diagnosis not present

## 2021-08-28 HISTORY — DX: Hypothyroidism, unspecified: E03.9

## 2021-08-28 HISTORY — PX: EXCISION MASS UPPER EXTREMETIES: SHX6704

## 2021-08-28 HISTORY — DX: Concussion with loss of consciousness status unknown, initial encounter: S06.0XAA

## 2021-08-28 HISTORY — DX: Presence of spectacles and contact lenses: Z97.3

## 2021-08-28 HISTORY — DX: Gastro-esophageal reflux disease without esophagitis: K21.9

## 2021-08-28 LAB — CBC
HCT: 43 % (ref 36.0–46.0)
Hemoglobin: 13.6 g/dL (ref 12.0–15.0)
MCH: 29.2 pg (ref 26.0–34.0)
MCHC: 31.6 g/dL (ref 30.0–36.0)
MCV: 92.3 fL (ref 80.0–100.0)
Platelets: 242 10*3/uL (ref 150–400)
RBC: 4.66 MIL/uL (ref 3.87–5.11)
RDW: 13.8 % (ref 11.5–15.5)
WBC: 11 10*3/uL — ABNORMAL HIGH (ref 4.0–10.5)
nRBC: 0 % (ref 0.0–0.2)

## 2021-08-28 LAB — CBC WITH DIFFERENTIAL/PLATELET
Abs Immature Granulocytes: 0.07 10*3/uL (ref 0.00–0.07)
Basophils Absolute: 0.1 10*3/uL (ref 0.0–0.1)
Basophils Relative: 1 %
Eosinophils Absolute: 0.2 10*3/uL (ref 0.0–0.5)
Eosinophils Relative: 2 %
HCT: 42.1 % (ref 36.0–46.0)
Hemoglobin: 13.7 g/dL (ref 12.0–15.0)
Immature Granulocytes: 1 %
Lymphocytes Relative: 27 %
Lymphs Abs: 2.4 10*3/uL (ref 0.7–4.0)
MCH: 29.6 pg (ref 26.0–34.0)
MCHC: 32.5 g/dL (ref 30.0–36.0)
MCV: 90.9 fL (ref 80.0–100.0)
Monocytes Absolute: 0.7 10*3/uL (ref 0.1–1.0)
Monocytes Relative: 9 %
Neutro Abs: 5.3 10*3/uL (ref 1.7–7.7)
Neutrophils Relative %: 60 %
Platelets: 227 10*3/uL (ref 150–400)
RBC: 4.63 MIL/uL (ref 3.87–5.11)
RDW: 13.6 % (ref 11.5–15.5)
WBC: 8.7 10*3/uL (ref 4.0–10.5)
nRBC: 0 % (ref 0.0–0.2)

## 2021-08-28 LAB — TROPONIN I (HIGH SENSITIVITY)
Troponin I (High Sensitivity): 2 ng/L (ref <18)
Troponin I (High Sensitivity): 3 ng/L (ref <18)
Troponin I (High Sensitivity): 3 ng/L (ref <18)
Troponin I (High Sensitivity): 3 ng/L (ref <18)

## 2021-08-28 LAB — D-DIMER, QUANTITATIVE: D-Dimer, Quant: <0.27 ug/mL-FEU (ref 0.00–0.50)

## 2021-08-28 LAB — POCT I-STAT, CHEM 8
BUN: 17 mg/dL (ref 8–23)
Calcium, Ion: 1.02 mmol/L — ABNORMAL LOW (ref 1.15–1.40)
Chloride: 104 mmol/L (ref 98–111)
Creatinine, Ser: 0.6 mg/dL (ref 0.44–1.00)
Glucose, Bld: 175 mg/dL — ABNORMAL HIGH (ref 70–99)
HCT: 45 % (ref 36.0–46.0)
Hemoglobin: 15.3 g/dL — ABNORMAL HIGH (ref 12.0–15.0)
Potassium: 4.3 mmol/L (ref 3.5–5.1)
Sodium: 140 mmol/L (ref 135–145)
TCO2: 26 mmol/L (ref 22–32)

## 2021-08-28 LAB — RESP PANEL BY RT-PCR (FLU A&B, COVID) ARPGX2
Influenza A by PCR: NEGATIVE
Influenza B by PCR: NEGATIVE
SARS Coronavirus 2 by RT PCR: NEGATIVE

## 2021-08-28 LAB — COMPREHENSIVE METABOLIC PANEL
ALT: 16 U/L (ref 0–44)
AST: 24 U/L (ref 15–41)
Albumin: 3.7 g/dL (ref 3.5–5.0)
Alkaline Phosphatase: 56 U/L (ref 38–126)
Anion gap: 10 (ref 5–15)
BUN: 15 mg/dL (ref 8–23)
CO2: 25 mmol/L (ref 22–32)
Calcium: 8 mg/dL — ABNORMAL LOW (ref 8.9–10.3)
Chloride: 105 mmol/L (ref 98–111)
Creatinine, Ser: 0.57 mg/dL (ref 0.44–1.00)
GFR, Estimated: >60 mL/min (ref >60)
Glucose, Bld: 148 mg/dL — ABNORMAL HIGH (ref 70–99)
Potassium: 4 mmol/L (ref 3.5–5.1)
Sodium: 140 mmol/L (ref 135–145)
Total Bilirubin: 0.6 mg/dL (ref 0.3–1.2)
Total Protein: 6.4 g/dL — ABNORMAL LOW (ref 6.5–8.1)

## 2021-08-28 LAB — HIV ANTIBODY (ROUTINE TESTING W REFLEX): HIV Screen 4th Generation wRfx: NONREACTIVE

## 2021-08-28 LAB — GLUCOSE, CAPILLARY
Glucose-Capillary: 162 mg/dL — ABNORMAL HIGH (ref 70–99)
Glucose-Capillary: 180 mg/dL — ABNORMAL HIGH (ref 70–99)

## 2021-08-28 LAB — TSH: TSH: 0.582 u[IU]/mL (ref 0.350–4.500)

## 2021-08-28 LAB — CREATININE, SERUM
Creatinine, Ser: 0.81 mg/dL (ref 0.44–1.00)
GFR, Estimated: >60 mL/min (ref >60)

## 2021-08-28 SURGERY — EXCISION MASS UPPER EXTREMITIES
Anesthesia: Monitor Anesthesia Care | Site: Arm Upper | Laterality: Left

## 2021-08-28 MED ORDER — TRAMADOL HCL 50 MG PO TABS: 50.0000 mg | ORAL_TABLET | Freq: Four times a day (QID) | ORAL | 0 refills | Status: AC | PRN

## 2021-08-28 MED ORDER — ONDANSETRON HCL 4 MG/2ML IJ SOLN
INTRAMUSCULAR | Status: DC | PRN
  Administered 2021-08-28: 4 mg via INTRAVENOUS

## 2021-08-28 MED ORDER — METOPROLOL TARTRATE 25 MG PO TABS
25.0000 mg | ORAL_TABLET | Freq: Two times a day (BID) | ORAL | Status: DC
  Administered 2021-08-28: 25 mg via ORAL
  Filled 2021-08-28: qty 1

## 2021-08-28 MED ORDER — INSULIN ASPART 100 UNIT/ML IJ SOLN
0.0000 [IU] | Freq: Every day | INTRAMUSCULAR | Status: DC
  Filled 2021-08-28: qty 0.05

## 2021-08-28 MED ORDER — DAPAGLIFLOZIN PROPANEDIOL 10 MG PO TABS
10.0000 mg | ORAL_TABLET | Freq: Every day | ORAL | Status: DC
  Administered 2021-08-29: 10 mg via ORAL
  Filled 2021-08-28: qty 1

## 2021-08-28 MED ORDER — CHLORHEXIDINE GLUCONATE CLOTH 2 % EX PADS: 6.0000 | MEDICATED_PAD | Freq: Once | CUTANEOUS | Status: DC

## 2021-08-28 MED ORDER — FENTANYL CITRATE (PF) 100 MCG/2ML IJ SOLN: 25.0000 ug | INTRAMUSCULAR | Status: DC | PRN

## 2021-08-28 MED ORDER — METOPROLOL TARTRATE 5 MG/5ML IV SOLN: 5.0000 mg | Freq: Four times a day (QID) | INTRAVENOUS | Status: DC | PRN

## 2021-08-28 MED ORDER — ONDANSETRON HCL 4 MG/2ML IJ SOLN: 4.0000 mg | Freq: Four times a day (QID) | INTRAMUSCULAR | Status: DC | PRN

## 2021-08-28 MED ORDER — CEFAZOLIN SODIUM-DEXTROSE 2-4 GM/100ML-% IV SOLN
2.0000 g | INTRAVENOUS | Status: DC
  Administered 2021-08-28: 2 g via INTRAVENOUS

## 2021-08-28 MED ORDER — TRAMADOL HCL 50 MG PO TABS
50.0000 mg | ORAL_TABLET | Freq: Three times a day (TID) | ORAL | Status: DC | PRN
  Administered 2021-08-29: 50 mg via ORAL
  Filled 2021-08-28: qty 1

## 2021-08-28 MED ORDER — IBUPROFEN 200 MG PO TABS: 400.0000 mg | ORAL_TABLET | Freq: Four times a day (QID) | ORAL | Status: DC | PRN

## 2021-08-28 MED ORDER — LEVOTHYROXINE SODIUM 75 MCG PO TABS
150.0000 ug | ORAL_TABLET | Freq: Every morning | ORAL | Status: DC
  Administered 2021-08-29: 150 ug via ORAL
  Filled 2021-08-28: qty 2

## 2021-08-28 MED ORDER — TRAMADOL HCL 50 MG PO TABS: 50.0000 mg | ORAL_TABLET | Freq: Four times a day (QID) | ORAL | Status: DC | PRN

## 2021-08-28 MED ORDER — KETAMINE HCL 50 MG/5ML IJ SOSY
PREFILLED_SYRINGE | INTRAMUSCULAR | Status: AC
  Filled 2021-08-28: qty 5

## 2021-08-28 MED ORDER — LINAGLIPTIN 5 MG PO TABS
5.0000 mg | ORAL_TABLET | Freq: Every day | ORAL | Status: DC
  Administered 2021-08-29: 5 mg via ORAL
  Filled 2021-08-28: qty 1

## 2021-08-28 MED ORDER — ACETAMINOPHEN 500 MG PO TABS
1000.0000 mg | ORAL_TABLET | Freq: Once | ORAL | Status: AC
  Administered 2021-08-28: 1000 mg via ORAL

## 2021-08-28 MED ORDER — KETAMINE HCL 10 MG/ML IJ SOLN
INTRAMUSCULAR | Status: DC | PRN
  Administered 2021-08-28 (×2): 10 mg via INTRAVENOUS

## 2021-08-28 MED ORDER — OXYCODONE HCL 5 MG PO TABS: 5.0000 mg | ORAL_TABLET | Freq: Once | ORAL | Status: DC | PRN

## 2021-08-28 MED ORDER — ONDANSETRON HCL 4 MG/2ML IJ SOLN: 4.0000 mg | Freq: Once | INTRAMUSCULAR | Status: DC | PRN

## 2021-08-28 MED ORDER — ACETAMINOPHEN 500 MG PO TABS
1000.0000 mg | ORAL_TABLET | Freq: Four times a day (QID) | ORAL | Status: DC | PRN
  Administered 2021-08-28 – 2021-08-29 (×3): 1000 mg via ORAL
  Filled 2021-08-28 (×3): qty 2

## 2021-08-28 MED ORDER — FENTANYL CITRATE (PF) 100 MCG/2ML IJ SOLN
INTRAMUSCULAR | Status: AC
  Filled 2021-08-28: qty 2

## 2021-08-28 MED ORDER — CEFAZOLIN SODIUM-DEXTROSE 2-4 GM/100ML-% IV SOLN
INTRAVENOUS | Status: AC
  Filled 2021-08-28: qty 100

## 2021-08-28 MED ORDER — OXYCODONE HCL 5 MG/5ML PO SOLN: 5.0000 mg | Freq: Once | ORAL | Status: DC | PRN

## 2021-08-28 MED ORDER — BUPIVACAINE-EPINEPHRINE 0.5% -1:200000 IJ SOLN
INTRAMUSCULAR | Status: DC | PRN
  Administered 2021-08-28: 10 mL

## 2021-08-28 MED ORDER — INSULIN ASPART 100 UNIT/ML IJ SOLN
0.0000 [IU] | Freq: Three times a day (TID) | INTRAMUSCULAR | Status: DC
  Administered 2021-08-29: 3 [IU] via SUBCUTANEOUS
  Filled 2021-08-28: qty 0.15

## 2021-08-28 MED ORDER — PROPOFOL 10 MG/ML IV BOLUS
INTRAVENOUS | Status: DC | PRN
  Administered 2021-08-28 (×2): 10 mg via INTRAVENOUS

## 2021-08-28 MED ORDER — ACETAMINOPHEN 500 MG PO TABS
ORAL_TABLET | ORAL | Status: AC
  Filled 2021-08-28: qty 2

## 2021-08-28 MED ORDER — 0.9 % SODIUM CHLORIDE (POUR BTL) OPTIME
TOPICAL | Status: DC | PRN
  Administered 2021-08-28: 500 mL

## 2021-08-28 MED ORDER — SIMVASTATIN 20 MG PO TABS
10.0000 mg | ORAL_TABLET | Freq: Every day | ORAL | Status: DC
  Administered 2021-08-28: 10 mg via ORAL
  Filled 2021-08-28: qty 1

## 2021-08-28 MED ORDER — AMISULPRIDE (ANTIEMETIC) 5 MG/2ML IV SOLN: 10.0000 mg | Freq: Once | INTRAVENOUS | Status: DC | PRN

## 2021-08-28 MED ORDER — ONDANSETRON HCL 4 MG PO TABS: 4.0000 mg | ORAL_TABLET | Freq: Four times a day (QID) | ORAL | Status: DC | PRN

## 2021-08-28 MED ORDER — ENOXAPARIN SODIUM 40 MG/0.4ML IJ SOSY
40.0000 mg | PREFILLED_SYRINGE | INTRAMUSCULAR | Status: DC
  Administered 2021-08-28: 40 mg via SUBCUTANEOUS
  Filled 2021-08-28: qty 0.4

## 2021-08-28 MED ORDER — LIDOCAINE HCL (CARDIAC) PF 100 MG/5ML IV SOSY
PREFILLED_SYRINGE | INTRAVENOUS | Status: DC | PRN
  Administered 2021-08-28: 40 mg via INTRAVENOUS

## 2021-08-28 MED ORDER — PROPOFOL 500 MG/50ML IV EMUL
INTRAVENOUS | Status: DC | PRN
  Administered 2021-08-28: 75 ug/kg/min via INTRAVENOUS

## 2021-08-28 MED ORDER — INSULIN ASPART 100 UNIT/ML IJ SOLN
3.0000 [IU] | Freq: Three times a day (TID) | INTRAMUSCULAR | Status: DC
  Administered 2021-08-29 (×2): 3 [IU] via SUBCUTANEOUS
  Filled 2021-08-28: qty 0.03

## 2021-08-28 MED ORDER — FENTANYL CITRATE (PF) 100 MCG/2ML IJ SOLN
INTRAMUSCULAR | Status: DC | PRN
  Administered 2021-08-28: 50 ug via INTRAVENOUS

## 2021-08-28 MED ORDER — SENNA 8.6 MG PO TABS
1.0000 | ORAL_TABLET | Freq: Two times a day (BID) | ORAL | Status: DC
  Administered 2021-08-28 – 2021-08-29 (×2): 8.6 mg via ORAL
  Filled 2021-08-28 (×2): qty 1

## 2021-08-28 MED ORDER — LACTATED RINGERS IV SOLN: INTRAVENOUS | Status: DC

## 2021-08-28 SURGICAL SUPPLY — 45 items
ADH SKN CLS APL DERMABOND .7 (GAUZE/BANDAGES/DRESSINGS) ×1
APL PRP STRL LF DISP 70% ISPRP (MISCELLANEOUS) ×1
APL SKNCLS STERI-STRIP NONHPOA (GAUZE/BANDAGES/DRESSINGS)
BENZOIN TINCTURE PRP APPL 2/3 (GAUZE/BANDAGES/DRESSINGS) ×1 IMPLANT
BLADE SURG 15 STRL LF DISP TIS (BLADE) ×1 IMPLANT
BLADE SURG 15 STRL SS (BLADE) ×2
CHLORAPREP W/TINT 26 (MISCELLANEOUS) ×2 IMPLANT
CLEANER CAUTERY TIP 5X5 PAD (MISCELLANEOUS) IMPLANT
COVER BACK TABLE 60X90IN (DRAPES) ×2 IMPLANT
COVER MAYO STAND STRL (DRAPES) ×2 IMPLANT
DERMABOND ADVANCED (GAUZE/BANDAGES/DRESSINGS) ×1
DERMABOND ADVANCED .7 DNX12 (GAUZE/BANDAGES/DRESSINGS) IMPLANT
DRAPE LAPAROTOMY 100X72 PEDS (DRAPES) ×1 IMPLANT
DRAPE SHEET LG 3/4 BI-LAMINATE (DRAPES) ×1 IMPLANT
DRAPE UTILITY XL STRL (DRAPES) ×2 IMPLANT
DRSG TEGADERM 2-3/8X2-3/4 SM (GAUZE/BANDAGES/DRESSINGS) IMPLANT
DRSG TEGADERM 4X4.75 (GAUZE/BANDAGES/DRESSINGS) IMPLANT
ELECT REM PT RETURN 9FT ADLT (ELECTROSURGICAL) ×2
ELECTRODE REM PT RTRN 9FT ADLT (ELECTROSURGICAL) ×1 IMPLANT
GAUZE 4X4 16PLY ~~LOC~~+RFID DBL (SPONGE) ×2 IMPLANT
GAUZE SPONGE 4X4 12PLY STRL (GAUZE/BANDAGES/DRESSINGS) ×1 IMPLANT
GLOVE SURG ORTHO LTX SZ8 (GLOVE) ×2 IMPLANT
GLOVE SURG POLYISO LF SZ7 (GLOVE) ×1 IMPLANT
GLOVE SURG UNDER POLY LF SZ7.5 (GLOVE) ×2 IMPLANT
GOWN SRG XL 47XLVL 3 REINF (GOWN DISPOSABLE) IMPLANT
GOWN STRL REIN XL LVL3 (GOWN DISPOSABLE) ×2
GOWN STRL REUS W/TWL LRG LVL3 (GOWN DISPOSABLE) ×3 IMPLANT
KIT TURNOVER CYSTO (KITS) ×2 IMPLANT
NDL HYPO 25X1 1.5 SAFETY (NEEDLE) ×1 IMPLANT
NEEDLE HYPO 25X1 1.5 SAFETY (NEEDLE) ×2 IMPLANT
NS IRRIG 500ML POUR BTL (IV SOLUTION) ×2 IMPLANT
PACK BASIN DAY SURGERY FS (CUSTOM PROCEDURE TRAY) ×2 IMPLANT
PAD CLEANER CAUTERY TIP 5X5 (MISCELLANEOUS)
PENCIL SMOKE EVACUATOR (MISCELLANEOUS) ×2 IMPLANT
STRIP CLOSURE SKIN 1/2X4 (GAUZE/BANDAGES/DRESSINGS) ×1 IMPLANT
SUT ETHILON 3 0 PS 1 (SUTURE) IMPLANT
SUT ETHILON 4 0 PS 2 18 (SUTURE) IMPLANT
SUT MNCRL AB 4-0 PS2 18 (SUTURE) ×2 IMPLANT
SUT VIC AB 3-0 SH 18 (SUTURE) IMPLANT
SUT VIC AB 4-0 PS2 18 (SUTURE) IMPLANT
SUT VICRYL 3-0 CR8 SH (SUTURE) ×2 IMPLANT
SYR CONTROL 10ML LL (SYRINGE) ×2 IMPLANT
TOWEL OR 17X26 10 PK STRL BLUE (TOWEL DISPOSABLE) ×2 IMPLANT
TUBE CONNECTING 12X1/4 (SUCTIONS) IMPLANT
WATER STERILE IRR 500ML POUR (IV SOLUTION) IMPLANT

## 2021-08-28 NOTE — ED Provider Notes (Signed)
[facility] DEPT Provider Note   CSN: [id number] Arrival date & time: 08/28/21  1121     History  Chief Complaint  Patient presents with   Chest Pain    Karina Herman is a 71 y.o. female.  Patient has a history of diabetes and elevated cholesterol.  She had a small lesion taken off her arm at the surgical center and while she was having the procedure she had episodes of SVT lasting 20 seconds.  This happened 3 times and then when she was in recovery she had a 2-minute episode where she had some chest discomfort.  She was sent over here for evaluation  The history is provided by the patient and medical records. No language interpreter was used.  Chest Pain Chest pain location: Chest pressure. Pain quality: aching   Pain radiates to:  Does not radiate Pain severity:  Mild Onset quality:  Sudden Timing:  Intermittent Chronicity:  New Context: not breathing   Relieved by:  Nothing Worsened by:  Nothing Ineffective treatments:  None tried Associated symptoms: abdominal pain   Associated symptoms: no back pain, no cough, no fatigue and no headache  9     Home Medications Prior to Admission medications   Medication Sig Start Date End Date Taking? Authorizing Provider  acetaminophen (TYLENOL) 500 MG tablet Take 1,000 mg by mouth every 6 (six) hours as needed for mild pain.   Yes [provider]  dapagliflozin propanediol (FARXIGA) 10 MG TABS tablet Take by mouth daily.   Yes [provider]  ibuprofen (ADVIL) 200 MG tablet Take 400 mg by mouth every 6 (six) hours as needed for mild pain or headache.   Yes [provider]  levothyroxine (SYNTHROID) 150 MCG tablet Take 150 mcg by mouth every morning. 06/05/21  Yes [provider]  metFORMIN (GLUCOPHAGE) 1000 MG tablet Take 1,000 mg by mouth 2 (two) times a day. 08/19/18  Yes [provider]  simvastatin (ZOCOR) 10 MG tablet Take 10 mg by mouth at bedtime.    Yes [provider]  sitaGLIPtin (JANUVIA) 50 MG tablet Take 50 mg by mouth daily.   Yes [provider]  traMADol (ULTRAM) 50 MG tablet Take 1-2 tablets (50-100 mg total) by mouth every 6 (six) hours as needed for moderate pain. 08/28/21   Armandina Gemma, MD      Allergies    Patient has no known allergies.    Review of Systems   Review of Systems  Constitutional:  Negative for appetite change and fatigue.  HENT:  Negative for congestion, ear discharge and sinus pressure.   Eyes:  Negative for discharge.  Respiratory:  Negative for cough.   Cardiovascular:  Positive for chest pain.       Palpitations  Gastrointestinal:  Positive for abdominal pain. Negative for diarrhea.  Genitourinary:  Negative for frequency and hematuria.  Musculoskeletal:  Negative for back pain.  Skin:  Negative for rash.  Neurological:  Negative for seizures and headaches.  Psychiatric/Behavioral:  Negative for hallucinations.    Physical Exam Updated Vital Signs BP (!) 138/59    Pulse 93    Temp 98.2 F (36.8 C) (Oral)    Resp 17    Ht 5\' 4"  (1.626 m)    Wt 78.5 kg    SpO2 96%    BMI 29.70 kg/m  Physical Exam Vitals and nursing note reviewed.  Constitutional:      Appearance: She is well-developed.  HENT:  Head: Normocephalic.     Nose: Nose normal.  Eyes:     General: No scleral icterus.    Conjunctiva/sclera: Conjunctivae normal.  Neck:     Thyroid: No thyromegaly.  Cardiovascular:     Rate and Rhythm: Normal rate and regular rhythm.     Heart sounds: No murmur heard.   No friction rub. No gallop.  Pulmonary:     Breath sounds: No stridor. No wheezing or rales.  Chest:     Chest wall: No tenderness.  Abdominal:     General: There is no distension.     Tenderness: There is no abdominal tenderness. There is no rebound.  Musculoskeletal:        General: Normal range of motion.     Cervical back: Neck supple.  Lymphadenopathy:     Cervical: No cervical adenopathy.   Skin:    Findings: No erythema or rash.  Neurological:     Mental Status: She is alert and oriented to person, place, and time.     Motor: No abnormal muscle tone.     Coordination: Coordination normal.  Psychiatric:        Behavior: Behavior normal.    ED Results / Procedures / Treatments   Labs (all labs ordered are listed, but only abnormal results are displayed) Labs Reviewed  COMPREHENSIVE METABOLIC PANEL - Abnormal; Notable for the following components:      Result Value   Glucose, Bld 148 (*)    Calcium 8.0 (*)    Total Protein 6.4 (*)    All other components within normal limits  CBC WITH DIFFERENTIAL/PLATELET  D-DIMER, QUANTITATIVE  TROPONIN I (HIGH SENSITIVITY)  TROPONIN I (HIGH SENSITIVITY)    EKG None  Radiology DG Chest Port 1 View  Result Date: 08/28/2021 CLINICAL DATA:  Pain, tachycardia EXAM: PORTABLE CHEST 1 VIEW COMPARISON:  02/17/2019 FINDINGS: The heart size and mediastinal contours are within normal limits. Chronic elevation of the right hemidiaphragm. Mild bibasilar subsegmental atelectasis. Lungs appear otherwise clear. No pleural effusion or pneumothorax. The visualized skeletal structures are unremarkable. IMPRESSION: No active disease. Electronically Signed   By: Davina Poke D.O.   On: 08/28/2021 13:03    Procedures Procedures    Medications Ordered in ED Medications - No data to display  ED Course/ Medical Decision Making/ A&P   Patient had 4 more episodes of SVT.  3 of them lasted 20 seconds 1 lasted 5 minutes.  I spoke with cardiology Dr. Oval Linsey and she recommended admission to the hospital with observation.  She did not recommend starting any medicines at this time.  Patient will be admitted to medicine and cardiology will see the patient tomorrow                        This patient presents to the ED for concern of palpitations, this involves an extensive number of treatment options, and is a complaint that carries with it a high  risk of complications and morbidity.  The differential diagnosis includes SVT, PE,   Co morbidities that complicate the patient evaluation  Diabetes elevated cholesterol   Additional history obtained:  Additional history obtained from significant other External records from outside source obtained and reviewed including hospital records   Lab Tests:  I Ordered, and personally interpreted labs.  The pertinent results include: CBC and chemistry showed mild elevation of glucose of 148   Imaging Studies ordered:  I ordered imaging studies including chest x-ray I independently  visualized and interpreted imaging which showed negative I agree with the radiologist interpretation   Cardiac Monitoring:  The patient was maintained on a cardiac monitor.  I personally viewed and interpreted the cardiac monitored which showed an underlying rhythm of: Normal sinus rhythm with episodes of SVT   Medicines ordered and prescription drug management:  No medicines ordered Reevaluation of the patient after these medicines showed that the patient improved I have reviewed the patients home medicines and have made adjustments as needed   Test Considered:  CT chest   Critical Interventions:  None   Consultations Obtained:  I requested consultation with the cardiology,  and discussed lab and imaging findings as well as pertinent plan - they recommend: Admission observation   Problem List / ED Course:  SVT, diabetes   Reevaluation:  After the interventions noted above, I reevaluated the patient and found that they have :stayed the same   Social Determinants of Health:  None   Dispostion:  After consideration of the diagnostic results and the patients response to treatment, I feel that the patent would benefit from admission with cardiology consult.   Medical Decision Making Amount and/or Complexity of Data Reviewed Labs: ordered. Radiology: ordered.  Risk Decision  regarding hospitalization.   Patient with numerous SVT episodes        Final Clinical Impression(s) / ED Diagnoses Final diagnoses:  SVT (supraventricular tachycardia) ([place])    Rx / DC Orders ED Discharge Orders     None         Milton Ferguson, MD 08/30/21 [phone]

## 2021-08-28 NOTE — Discharge Instructions (Addendum)
°  [facility] -- DISCHARGE INSTRUCTIONS  REMINDER:   Carry a list of your medications and allergies with you at all times  Call your pharmacy at least 1 week in advance to refill prescriptions  Do not mix any prescribed pain medicine with alcohol  Do not drive any motor vehicles while taking pain medication  Take medications with food unless otherwise directed  Follow-up appointments (date to return to physician): Please call [phone] to confirm your follow up appointment with your surgeon.  Call your Surgeon if you have:  Temperature greater than 101.0  Persistent nausea and vomiting  Severe uncontrolled pain  Redness, tenderness, or signs of infection (pain, swelling, redness, odor or green/yellow discharge around the site)  Difficulty breathing, headache or visual disturbances  Hives  Persistent dizziness or light-headedness  Any other questions or concerns you may have after discharge  In an emergency, call 911 or go to an Emergency Department at a nearby hospital.  Diet: Begin with liquids, and if they are tolerated, resume your usual diet.  Avoid spicy, greasy or heavy foods.  If you have nausea or vomiting, go back to liquids.  If you cannot keep liquids down, call your doctor.  Avoid alcohol consumption while on prescription pain medications. Good nutrition promotes healing. Increase fiber and fluids.   ADDITIONAL INSTRUCTIONS: Ice pack to area as needed the first two day.  Leave Dermabond 7-10 days.  May shower.  [facility] Office: [phone]    No acetaminophen/Tylenol until after 1:30pm today if needed for pain.    Post Anesthesia Home Care Instructions  Activity: Get plenty of rest for the remainder of the day. A responsible individual must stay with you for 24 hours following the procedure.  For the next 24 hours, DO NOT: -Drive a car -Paediatric nurse -Drink alcoholic beverages -Take any medication unless instructed by your  physician -Make any legal decisions or sign important papers.  Meals: Start with liquid foods such as gelatin or soup. Progress to regular foods as tolerated. Avoid greasy, spicy, heavy foods. If nausea and/or vomiting occur, drink only clear liquids until the nausea and/or vomiting subsides. Call your physician if vomiting continues.  Special Instructions/Symptoms: Your throat may feel dry or sore from the anesthesia or the breathing tube placed in your throat during surgery. If this causes discomfort, gargle with warm salt water. The discomfort should disappear within 24 hours.

## 2021-08-28 NOTE — Progress Notes (Signed)
Called to re-evaluate pt around 11AM in phase 2 of PACU- back in SVT int he 150s and now admitting to chest pain as well as nausea. Back in NSR in the 80s by the time formal EKG was done. decision to send pt to ED for evaluation by cardiology.   I personally answered all questions from patient and husband. Dr. Harlow Asa aware.

## 2021-08-28 NOTE — Addendum Note (Signed)
Addendum  created 08/28/21 1148 by Pervis Hocking, DO   Clinical Note Signed

## 2021-08-28 NOTE — ED Notes (Signed)
Pt had 4 min episode of ST.  ED RN and ED Tech in room w/ pt. Pt states "slight tightness in back of throat".  MD Zammit notified and aware.

## 2021-08-28 NOTE — ED Notes (Signed)
Pt had 1 minute episode of ST.  Pt coughing frequently to reduce heart rate.  ED RN in room w/ pt during episode.  Pt states midchest/back of throat discomfort during tachycardia. MD Zammit aware of episodes.

## 2021-08-28 NOTE — Progress Notes (Signed)
Patient brought to PACU from OR. CRNA made RN aware that at the end of the case, patient had run of SVT in 150's then would go back to base line after a few minutes. Patient's baseline heart rate in PACU was in the 80's. Patient was asymptomatic for duration of time in PACU. Patient would experience tachycardia episodes every 10 minutes x3 after she was brought to PACU. Patient was asymptomatic during each episode. After patients first episode in PACU, patient was asked by CRNA if she had ever had any dizzy spells and patient stated that she had one during Christmas where she felt like she was going to pass out. The patient stated that she never followed up on that. Patient had not been given any medication by PACU nurse and did not complain of any pain other than at her incision site which resolved once ice pack was placed. Dr. Doroteo Glassman was called by CRNA after patient was settled. Dr. Doroteo Glassman assessed patient and patient denied any previous heart issues and stated that she did not have a cardiologist.  Dr. Doroteo Glassman called to get patient a consult with cardiology on 08/29/2021. Patient continued to have runs of tachycardia, but was asymptomatic with the episodes and they were not as frequent.   The patient's O2 saturations started to drop into the high 80's on room air when asleep. Patient's saturations would correct within normal limits on their own, but would drop again within a couple minutes while awake. RN asked patient to cough to clear lungs and that would help saturations as well. Patient was placed back on nasal canula 2 liters. Dr. Doroteo Glassman notified by phone and advised RN to perform incentive spirometry with patient. Patient performed exercises and did not experience any chest pain, shortness of breath, chest tightness, or dizziness. Patients saturations continued to drop and come back to baseline after incentive spirometry use. Dr. Doroteo Glassman called to check on patient and was informed of patients  continued saturation changes despite interventions and she stated that as long as long as patient was correcting saturations that she was okay to move over and to call if there were any other issues. Patient was moved to phase II alert and oriented x4 with no symptoms of chest pain, tightness, shortness of breath, or dizziness, and skin warm and dry. Patient handoff given to Boykin Reaper, RN. RN informed of patients tachycardia episodes and saturation drops and advised possible continuous monitoring in phase II.

## 2021-08-28 NOTE — Op Note (Signed)
Operative Note  Pre-operative Diagnosis:  soft tissue mass left upper arm  Post-operative Diagnosis:  same  Surgeon:  Armandina Gemma, MD  Assistant:  none   Procedure:  excision soft tissue mass left upper arm, 3.5 x 2.0 x 1.5 cm, subcutaneous  Anesthesia:  local with IV sedation  Estimated Blood Loss:  minimal  Drains: none         Specimen: to pathology  Indications:  Patient presents on referral from her primary care provider for surgical evaluation and management of an enlarging soft tissue mass on the left upper arm. Patient states this has been present for 5 to 6 years. It is gradually increased in size. She has begun to experience some discomfort. She does not remember any history of trauma. There has been no drainage. She has no other such lesions on her extremities or her torso. Patient is known to my practice from previous thyroid surgery.  Procedure:  The patient was seen in the pre-op holding area. The risks, benefits, complications, treatment options, and expected outcomes were previously discussed with the patient. The patient agreed with the proposed plan and has signed the informed consent form.  The patient was brought to the operating room by the surgical team, identified as Karina Herman and the procedure verified. A "time out" was completed and the above information confirmed.  Following administration of intravenous sedation the patient is prepped and draped in the usual aseptic fashion.  After ascertaining that an adequate level sedation of been achieved, the skin was infiltrated with local anesthetic over the location of the mass in the anterior lateral upper left arm.  A 3 cm incision was made with a #15 blade and carried through subcutaneous tissues.  Mass was identified.  It is violaceous in color.  It is multilobulated.  It is relatively firm.  Using the electrocautery the mass was excised in its entirety and submitted to pathology for review.  Good hemostasis was  obtained throughout the field.  Subcutaneous tissues were closed with interrupted 3-0 Vicryl sutures.  Skin was closed with a running 4-0 Monocryl subcuticular suture.  Wound was washed and dried and Dermabond was placed as dressing.  Patient was awakened from sedation and transported to the recovery room in stable condition.  The patient tolerated the procedure well.   Armandina Gemma, [facility] Office: [phone]

## 2021-08-28 NOTE — Addendum Note (Signed)
Addendum  created 08/28/21 1522 by Georgeanne Nim, CRNA   Intraprocedure Meds edited

## 2021-08-28 NOTE — Anesthesia Postprocedure Evaluation (Addendum)
Anesthesia Post Note  Patient: Karina Herman  Procedure(s) Performed: EXCISION SOFT TISSUE MASS LEFT UPPER ARM (Left: Arm Upper)     Patient location during evaluation: PACU Anesthesia Type: MAC Level of consciousness: awake and alert Pain management: pain level controlled Vital Signs Assessment: post-procedure vital signs reviewed and stable Respiratory status: spontaneous breathing, nonlabored ventilation and respiratory function stable Cardiovascular status: blood pressure returned to baseline and stable Postop Assessment: no apparent nausea or vomiting Anesthetic complications: no Comments: Pt seen and evaluated in PACU- has been going in and out of SVT intraoperatively as well as in PACU. Episodes break w/ vagal manuevers and have not needed pharmacological treatment. Upon further questioning, she has been experiencing episodes of dizziness on and off for about a month now. I called the patient's PCP Carolee Rota from [facility] in [facility], who said she would fill out referral paperwork for cardiologist. I called and made appointment for [facility], will likely need holter monitor. Pt understands, all questions answered.    Called to re-evaluate pt around 11AM in phase 2- back in SVT and having chest pain as well as nausea. Back in sinus by the time formal EKG was done. decision to send pt to ED for evaluation by cardiology.    No notable events documented.  Last Vitals:  Vitals:   08/28/21 0930 08/28/21 0936  BP: 126/69   Pulse: 85 (!) 150  Resp: 17 15  Temp:    SpO2: 97% 97%    Last Pain:  Vitals:   08/28/21 0930  TempSrc:   PainSc: 0-No pain                 Pervis Hocking

## 2021-08-28 NOTE — Plan of Care (Signed)
  Problem: Health Behavior/Discharge Planning: Goal: Ability to manage health-related needs will improve Outcome: Progressing   

## 2021-08-28 NOTE — Addendum Note (Signed)
Addendum  created 08/28/21 1126 by Georgeanne Nim, CRNA   Flowsheet accepted

## 2021-08-28 NOTE — Addendum Note (Signed)
Addendum  created 08/28/21 1516 by Georgeanne Nim, CRNA   Flowsheet accepted

## 2021-08-28 NOTE — Transfer of Care (Signed)
Immediate Anesthesia Transfer of Care Note  Patient: Karina Herman  Procedure(s) Performed: EXCISION SOFT TISSUE MASS LEFT UPPER ARM (Left: Arm Upper)  Patient Location: PACU  Anesthesia Type:MAC  Level of Consciousness: awake, alert  and oriented  Airway & Oxygen Therapy: Patient Spontanous Breathing and Patient connected to nasal cannula oxygen  Post-op Assessment: Report given to RN and Post -op Vital signs reviewed and stable  Post vital signs: Reviewed and stable  Last Vitals:  Vitals Value Taken Time  BP 140/64 08/28/21 0915  Temp    Pulse 85 08/28/21 0920  Resp 18 08/28/21 0920  SpO2 96 % 08/28/21 0920  Vitals shown include unvalidated device data.  Last Pain:  Vitals:   08/28/21 0724  TempSrc: Oral  PainSc: 3       Patients Stated Pain Goal: 5 (05/01/56 4734)  Complications: No notable events documented.

## 2021-08-28 NOTE — Progress Notes (Signed)
Patient continues to have runs of HR @ 150 in phase II.  Oxygen level 93% RA.  BP 132/86.  When patient has the episode of elevated heart rate she complains of chest tightness.   Anesthesia made aware.  EKG performed.   Pt to be taken to the ED for evaluation.  Spouse at bedside. Pt taken to the ED in stable condition.

## 2021-08-28 NOTE — ED Triage Notes (Signed)
Pt from surgery center post surgery.  Pt had runs of SVT  w/ associated chest tightness in PACU.  Pt sent here for eval.

## 2021-08-28 NOTE — Interval H&P Note (Signed)
History and Physical Interval Note:  08/28/2021 8:15 AM  Karina Herman  has presented today for surgery, with the diagnosis of SOFT TISSUE MASS LEFT UPPER ARM.  The various methods of treatment have been discussed with the patient and family. After consideration of risks, benefits and other options for treatment, the patient has consented to    Procedure(s): EXCISION SOFT TISSUE MASS LEFT UPPER ARM (Left) as a surgical intervention.    The patient's history has been reviewed, patient examined, no change in status, stable for surgery.  I have reviewed the patient's chart and labs.  Questions were answered to the patient's satisfaction.    Armandina Gemma, [facility] Office: [place]

## 2021-08-28 NOTE — H&P (Signed)
History and Physical  Karina Herman ZOX:[id number] DOB: [birth year] DOA: 08/28/2021  PCP: Aura Dials, MD Patient coming from: surgery  I have personally briefly reviewed patient's old medical records in [place]   Chief Complaint: SVT  HPI: ARRIA NAIM is a 71 y.o. female past medical history of diabetes mellitus type 2, who on the day of discharge had a surgical excision of a soft tissue mass on the left upper arm that has been present for 5 to 6 years which has been increasing in size gradually but more this year he does not remember any trauma postsurgical procedure heart rate went to 150 satting 93% on room air blood pressure is 132/86 she did not complain of chest tightness but she did relate that she felt something in her throat, twelve-lead EKG was done that showed sinus tachycardia.  In the ED: Twelve-lead EKG was done that shows sinus tachycardia D-dimer 0.2 other labs were unremarkable cardiac biomarkers were basically flat. The ED physician spoke to the cardiologist who related that the patient needed to be admitted for observation the ED physician call us for admission   Review of Systems: All systems reviewed and apart from history of presenting illness, are negative.  Past Medical History:  Diagnosis Date   Arm mass, left 08/25/2021   left upper   Arthritis    sorriatic arthritis pt thinks   Concussion    15 years ago no residual per pt on 08-25-2021   DM Type 2    GERD (gastroesophageal reflux disease)    diet controlled   High cholesterol    Hypothyroidism    Wears glasses    Past Surgical History:  Procedure Laterality Date   APPENDECTOMY     age 22 or 25   OVARIAN CYST REMOVAL  2004   THYROIDECTOMY N/A 02/21/2019   Procedure: TOTAL THYROIDECTOMY;  Surgeon: Armandina Gemma, MD;  Location: [place];  Service: General;  Laterality: N/A;   Social History:  reports that she quit smoking about 30 years ago. Her smoking use included cigarettes.  She has a 7.50 pack-year smoking history. She has never used smokeless tobacco. She reports that she does not currently use alcohol. She reports that she does not use drugs.   No Known Allergies  History reviewed. No pertinent family history.  Patient relates that her father died of a heart attack her mother of old age.  Prior to Admission medications   Medication Sig Start Date End Date Taking? Authorizing Provider  acetaminophen (TYLENOL) 500 MG tablet Take 1,000 mg by mouth every 6 (six) hours as needed for mild pain.   Yes [provider]  dapagliflozin propanediol (FARXIGA) 10 MG TABS tablet Take by mouth daily.   Yes [provider]  ibuprofen (ADVIL) 200 MG tablet Take 400 mg by mouth every 6 (six) hours as needed for mild pain or headache.   Yes [provider]  levothyroxine (SYNTHROID) 150 MCG tablet Take 150 mcg by mouth every morning. 06/05/21  Yes [provider]  metFORMIN (GLUCOPHAGE) 1000 MG tablet Take 1,000 mg by mouth 2 (two) times a day. 08/19/18  Yes [provider]  simvastatin (ZOCOR) 10 MG tablet Take 10 mg by mouth at bedtime.   Yes [provider]  sitaGLIPtin (JANUVIA) 50 MG tablet Take 50 mg by mouth daily.   Yes [provider]  traMADol (ULTRAM) 50 MG tablet Take 1-2 tablets (50-100 mg total) by mouth every 6 (six) hours  as needed for moderate pain. 08/28/21   Armandina Gemma, MD   Physical Exam: Vitals:   08/28/21 1542 08/28/21 1543 08/28/21 1544 08/28/21 1545  BP:      Pulse: (!) 152 (!) 151 (!) 149 93  Resp: (!) 22 19 20 17   Temp:      TempSrc:      SpO2: 98% 95% 95% 96%  Weight:      Height:        General exam: Moderately built and nourished patient, lying comfortably supine on the gurney in no obvious distress. Head, eyes and ENT: Nontraumatic and normocephalic. Pupils equally reacting to light and accommodation. Oral mucosa moist. Neck: Supple. No JVD, carotid bruit or  thyromegaly. Lymphatics: No lymphadenopathy. Respiratory system: Clear to auscultation. No increased work of breathing. Cardiovascular system: S1 and S2 heard, RRR. No JVD, murmurs, gallops, clicks or pedal edema. Gastrointestinal system: Abdomen is nondistended, soft and nontender. Normal bowel sounds heard. No organomegaly or masses appreciated. Central nervous system: Alert and oriented. No focal neurological deficits. Extremities: Symmetric 5 x 5 power. Peripheral pulses symmetrically felt.  Skin: No rashes or acute findings. Musculoskeletal system: Negative exam. Psychiatry: Pleasant and cooperative.   Labs on Admission:  Basic Metabolic Panel: Recent Labs  Lab 08/28/21 0740 08/28/21 1212  NA 140 140  K 4.3 4.0  CL 104 105  CO2  --  25  GLUCOSE 175* 148*  BUN 17 15  CREATININE 0.60 0.57  CALCIUM  --  8.0*   Liver Function Tests: Recent Labs  Lab 08/28/21 1212  AST 24  ALT 16  ALKPHOS 56  BILITOT 0.6  PROT 6.4*  ALBUMIN 3.7   No results for input(s): LIPASE, AMYLASE in the last 168 hours. No results for input(s): AMMONIA in the last 168 hours. CBC: Recent Labs  Lab 08/28/21 0740 08/28/21 1212  WBC  --  8.7  NEUTROABS  --  5.3  HGB 15.3* 13.7  HCT 45.0 42.1  MCV  --  90.9  PLT  --  227   Cardiac Enzymes: No results for input(s): CKTOTAL, CKMB, CKMBINDEX, TROPONINI in the last 168 hours.  BNP (last 3 results) No results for input(s): PROBNP in the last 8760 hours. CBG: Recent Labs  Lab 08/28/21 0934  GLUCAP 162*    Radiological Exams on Admission: DG Chest Port 1 View  Result Date: 08/28/2021 CLINICAL DATA:  Pain, tachycardia EXAM: PORTABLE CHEST 1 VIEW COMPARISON:  02/17/2019 FINDINGS: The heart size and mediastinal contours are within normal limits. Chronic elevation of the right hemidiaphragm. Mild bibasilar subsegmental atelectasis. Lungs appear otherwise clear. No pleural effusion or pneumothorax. The visualized skeletal structures are  unremarkable. IMPRESSION: No active disease. Electronically Signed   By: Davina Poke D.O.   On: 08/28/2021 13:03    EKG: Independently reviewed.  Shows sinus tachycardia normal axis nonspecific T wave changes  Assessment/Plan SVT (supraventricular tachycardia) (HCC) We will go ahead and admit her under observation, get a 2D echo cycle cardiac enzymes start her on metoprolol orally 25 mg. Is also put IV metoprolol for breakthrough heart rate. Cardiology has already been notified by the ED physician. Will admit her under observation.  Diabetes mellitus type 2, controlled ([place]) Hold her oral hypoglycemic agents especially metformin started on long-acting insulin plus sliding scale check hemoglobin A1c.  Hypothyroidism: Continue Synthroid.     DVT Prophylaxis: lovenox Code Status: Full  Family Communication: husband  Disposition Plan: obervation     It is my clinical opinion that  admission to observation is reasonable and necessary in this 71 y.o. female reports sent from the outpatient surgical center for intermittent SVT.  Given the aforementioned, the predictability of an adverse outcome is felt to be significant. I expect that the patient will require at least 2 midnights in the hospital to treat this condition.  Charlynne Cousins MD [facility]   08/28/2021, 4:31 PM

## 2021-08-28 NOTE — ED Notes (Signed)
Husband would like an update when wife gets a room, said to call the home phone number.

## 2021-08-28 NOTE — Progress Notes (Deleted)
Cardiology Office Note   Date:  08/28/2021   ID:  Karina Herman, DOB 1951-01-15, MRN [id number]  PCP:  Aura Dials, MD  Cardiologist:   None Referring:  ***  No chief complaint on file.     History of Present Illness: Karina Herman is a 71 y.o. female who presents for ***     Past Medical History:  Diagnosis Date   Arm mass, left 08/25/2021   left upper   Arthritis    sorriatic arthritis pt thinks   Concussion    15 years ago no residual per pt on 08-25-2021   DM Type 2    GERD (gastroesophageal reflux disease)    diet controlled   High cholesterol    Hypothyroidism    Wears glasses     Past Surgical History:  Procedure Laterality Date   APPENDECTOMY     age 41 or 72   OVARIAN CYST REMOVAL  2004   THYROIDECTOMY N/A 02/21/2019   Procedure: TOTAL THYROIDECTOMY;  Surgeon: Armandina Gemma, MD;  Location: [place];  Service: General;  Laterality: N/A;     No current facility-administered medications for this visit.   Current Outpatient Medications  Medication Sig Dispense Refill   acetaminophen (TYLENOL) 500 MG tablet Take 1,000 mg by mouth every 6 (six) hours as needed for mild pain.     dapagliflozin propanediol (FARXIGA) 10 MG TABS tablet Take by mouth daily.     ibuprofen (ADVIL) 200 MG tablet Take 400 mg by mouth every 6 (six) hours as needed for mild pain or headache.     levothyroxine (SYNTHROID) 150 MCG tablet Take 150 mcg by mouth every morning.     metFORMIN (GLUCOPHAGE) 1000 MG tablet Take 1,000 mg by mouth 2 (two) times a day.     simvastatin (ZOCOR) 10 MG tablet Take 10 mg by mouth at bedtime.     sitaGLIPtin (JANUVIA) 50 MG tablet Take 50 mg by mouth daily.     traMADol (ULTRAM) 50 MG tablet Take 1-2 tablets (50-100 mg total) by mouth every 6 (six) hours as needed for moderate pain. 12 tablet 0   Facility-Administered Medications Ordered in Other Visits  Medication Dose Route Frequency Provider Last Rate Last Admin   metoprolol tartrate  (LOPRESSOR) injection 5 mg  5 mg Intravenous Q6H PRN Charlynne Cousins, MD       metoprolol tartrate (LOPRESSOR) tablet 25 mg  25 mg Oral BID Charlynne Cousins, MD        Allergies:   Patient has no known allergies.    Social History:  The patient  reports that she quit smoking about 30 years ago. Her smoking use included cigarettes. She has a 7.50 pack-year smoking history. She has never used smokeless tobacco. She reports that she does not currently use alcohol. She reports that she does not use drugs.   Family History:  The patient's ***family history is not on file.    ROS:  Please see the history of present illness.   Otherwise, review of systems are positive for {NONE DEFAULTED:18576}.   All other systems are reviewed and negative.    PHYSICAL EXAM: VS:  There were no vitals taken for this visit. , BMI There is no height or weight on file to calculate BMI. GENERAL:  Well appearing HEENT:  Pupils equal round and reactive, fundi not visualized, oral mucosa unremarkable NECK:  No jugular venous distention, waveform within normal limits, carotid upstroke brisk and symmetric, no bruits,  no thyromegaly LYMPHATICS:  No cervical, inguinal adenopathy LUNGS:  Clear to auscultation bilaterally BACK:  No CVA tenderness CHEST:  Unremarkable HEART:  PMI not displaced or sustained,S1 and S2 within normal limits, no S3, no S4, no clicks, no rubs, *** murmurs ABD:  Flat, positive bowel sounds normal in frequency in pitch, no bruits, no rebound, no guarding, no midline pulsatile mass, no hepatomegaly, no splenomegaly EXT:  2 plus pulses throughout, no edema, no cyanosis no clubbing SKIN:  No rashes no nodules NEURO:  Cranial nerves II through XII grossly intact, motor grossly intact throughout PSYCH:  Cognitively intact, oriented to person place and time    EKG:  EKG {ACTION; IS/IS DHW:[id number]} ordered today. The ekg ordered today demonstrates ***   Recent Labs: 08/28/2021: ALT 16;  BUN 15; Creatinine, Ser 0.57; Hemoglobin 13.7; Platelets 227; Potassium 4.0; Sodium 140    Lipid Panel No results found for: CHOL, TRIG, HDL, CHOLHDL, VLDL, LDLCALC, LDLDIRECT    Wt Readings from Last 3 Encounters:  08/28/21 173 lb (78.5 kg)  08/28/21 173 lb 4.8 oz (78.6 kg)  10/02/19 190 lb (86.2 kg)      Other studies Reviewed: Additional studies/ records that were reviewed today include: ***. Review of the above records demonstrates:  Please see elsewhere in the note.  ***   ASSESSMENT AND PLAN:  ***   Current medicines are reviewed at length with the patient today.  The patient {ACTIONS; HAS/DOES NOT HAVE:19233} concerns regarding medicines.  The following changes have been made:  {PLAN; NO CHANGE:13088:s}  Labs/ tests ordered today include: *** No orders of the defined types were placed in this encounter.    Disposition:   FU with ***    Signed, Minus Breeding, MD  08/28/2021 6:44 PM    [facility]

## 2021-08-29 ENCOUNTER — Telehealth: Payer: Self-pay | Admitting: *Deleted

## 2021-08-29 ENCOUNTER — Ambulatory Visit: Payer: Medicare PPO | Admitting: Cardiology

## 2021-08-29 ENCOUNTER — Observation Stay (HOSPITAL_BASED_OUTPATIENT_CLINIC_OR_DEPARTMENT_OTHER): Payer: Medicare PPO

## 2021-08-29 ENCOUNTER — Encounter (HOSPITAL_COMMUNITY): Payer: Self-pay | Admitting: Internal Medicine

## 2021-08-29 DIAGNOSIS — R008 Other abnormalities of heart beat: Secondary | ICD-10-CM

## 2021-08-29 DIAGNOSIS — I471 Supraventricular tachycardia: Secondary | ICD-10-CM

## 2021-08-29 DIAGNOSIS — R0902 Hypoxemia: Secondary | ICD-10-CM

## 2021-08-29 LAB — ECHOCARDIOGRAM COMPLETE
AR max vel: 2.01 cm2
AV Area VTI: 2.23 cm2
AV Area mean vel: 2.12 cm2
AV Mean grad: 5 mmHg
AV Peak grad: 8.8 mmHg
Ao pk vel: 1.48 m/s
Area-P 1/2: 4.21 cm2
Calc EF: 62.1 %
Height: 64 in
S' Lateral: 1.8 cm
Single Plane A2C EF: 69.1 %
Single Plane A4C EF: 57.4 %
Weight: 2768 oz

## 2021-08-29 LAB — COMPREHENSIVE METABOLIC PANEL
ALT: 17 U/L (ref 0–44)
AST: 15 U/L (ref 15–41)
Albumin: 3.9 g/dL (ref 3.5–5.0)
Alkaline Phosphatase: 57 U/L (ref 38–126)
Anion gap: 11 (ref 5–15)
BUN: 18 mg/dL (ref 8–23)
CO2: 27 mmol/L (ref 22–32)
Calcium: 9 mg/dL (ref 8.9–10.3)
Chloride: 100 mmol/L (ref 98–111)
Creatinine, Ser: 0.65 mg/dL (ref 0.44–1.00)
GFR, Estimated: >60 mL/min (ref >60)
Glucose, Bld: 165 mg/dL — ABNORMAL HIGH (ref 70–99)
Potassium: 4.3 mmol/L (ref 3.5–5.1)
Sodium: 138 mmol/L (ref 135–145)
Total Bilirubin: 0.7 mg/dL (ref 0.3–1.2)
Total Protein: 6.7 g/dL (ref 6.5–8.1)

## 2021-08-29 LAB — GLUCOSE, CAPILLARY
Glucose-Capillary: 162 mg/dL — ABNORMAL HIGH (ref 70–99)
Glucose-Capillary: 113 mg/dL — ABNORMAL HIGH (ref 70–99)

## 2021-08-29 LAB — SURGICAL PATHOLOGY

## 2021-08-29 LAB — MAGNESIUM: Magnesium: 1.9 mg/dL (ref 1.7–2.4)

## 2021-08-29 MED ORDER — METOPROLOL TARTRATE 25 MG PO TABS: 25.0000 mg | ORAL_TABLET | Freq: Two times a day (BID) | ORAL | 3 refills | Status: DC

## 2021-08-29 MED ORDER — METOPROLOL TARTRATE 25 MG PO TABS
25.0000 mg | ORAL_TABLET | Freq: Two times a day (BID) | ORAL | Status: DC
  Administered 2021-08-29: 25 mg via ORAL
  Filled 2021-08-29: qty 1

## 2021-08-29 MED ORDER — METOPROLOL TARTRATE 50 MG PO TABS: 50.0000 mg | ORAL_TABLET | Freq: Two times a day (BID) | ORAL | Status: DC

## 2021-08-29 NOTE — Telephone Encounter (Signed)
Order placed and message to sleep pool

## 2021-08-29 NOTE — Discharge Summary (Signed)
Physician Discharge Summary  Karina Herman ZSM:[id number] DOB: 12/09/1950 DOA: 08/28/2021  PCP: Aura Dials, MD  Admit date: 08/28/2021 Discharge date: 08/29/2021  Admitted From: Home Disposition:  home  Recommendations for Outpatient Follow-up:  Follow up with Cards in 1-2 weeks Will need a sleep study as an outpatient Home Health:No Equipment/Devices:none  Discharge Condition:Stable CODE STATUS:Full Diet recommendation: Heart Healthy  Brief/Interim Summary:  71 y.o. female past medical history of diabetes mellitus recently had a soft tissue mass excision from her left upper extremity had SVT was sent to the ED twelve-lead EKG shows sinus tachycardia D-dimer was unremarkable cardiac biomarkers basically flat. Started on metoprolol cardiology consulted  Discharge Diagnoses:  Principal Problem:   Atrial tachycardia ([place]) Active Problems:   Diabetes mellitus type 2, controlled ([place])  SVT: 2D echo ef 49% LVH no diastolic dysfunction. She was started on metoprolol and her heart rate is improved cardiac biomarkers have remained flat. Cardiology was consulted and would like to see her as an outpatient. Will need a sleep study as an outpatient.  Diabetes mellitus type 2: No change made to her medication.  Discharge Instructions   Allergies as of 08/29/2021   No Known Allergies      Medication List     TAKE these medications    acetaminophen 500 MG tablet Commonly known as: TYLENOL Take 1,000 mg by mouth every 6 (six) hours as needed for mild pain.   dapagliflozin propanediol 10 MG Tabs tablet Commonly known as: FARXIGA Take by mouth daily.   ibuprofen 200 MG tablet Commonly known as: ADVIL Take 400 mg by mouth every 6 (six) hours as needed for mild pain or headache.   levothyroxine 150 MCG tablet Commonly known as: SYNTHROID Take 150 mcg by mouth every morning.   metFORMIN 1000 MG tablet Commonly known as: GLUCOPHAGE Take 1,000 mg by mouth 2 (two)  times a day.   metoprolol tartrate 25 MG tablet Commonly known as: LOPRESSOR Take 1 tablet (25 mg total) by mouth 2 (two) times daily.   simvastatin 10 MG tablet Commonly known as: ZOCOR Take 10 mg by mouth at bedtime.   sitaGLIPtin 50 MG tablet Commonly known as: JANUVIA Take 50 mg by mouth daily.   traMADol 50 MG tablet Commonly known as: ULTRAM Take 1-2 tablets (50-100 mg total) by mouth every 6 (six) hours as needed for moderate pain.        Follow-up Information     Loel Dubonnet, NP Follow up.   Specialty: Cardiology Why: [facility] - We have arranged for you to have a cardiology follow-up visit on Wednesday Sep 24, 2021 10:05 AM (Arrive by 9:50 AM). This will be at our [employer] location off [address]. Urban Gibson is one of our nurse practitioners with our team. The office will also call you to arrange a sleep study. Contact information: [place] [address] [phone]                No Known Allergies  Consultations: Cardiology   Procedures/Studies: DG Chest Port 1 View  Result Date: 08/28/2021 CLINICAL DATA:  Pain, tachycardia EXAM: PORTABLE CHEST 1 VIEW COMPARISON:  02/17/2019 FINDINGS: The heart size and mediastinal contours are within normal limits. Chronic elevation of the right hemidiaphragm. Mild bibasilar subsegmental atelectasis. Lungs appear otherwise clear. No pleural effusion or pneumothorax. The visualized skeletal structures are unremarkable. IMPRESSION: No active disease. Electronically Signed   By: Davina Poke D.O.   On: 08/28/2021 13:03  Subjective: No complaints ready to go home.  Discharge Exam: Vitals:   08/29/21 0513 08/29/21 0700  BP: (!) 110/45 (!) 112/56  Pulse: 99 70  Resp: 18 17  Temp: (!) 97.3 F (36.3 C) 98.7 F (37.1 C)  SpO2: 90% 90%   Vitals:   08/28/21 2013 08/29/21 0027 08/29/21 0513 08/29/21 0700  BP: (!) 114/48 (!) 102/50 (!) 110/45 (!) 112/56  Pulse:  98 90 99 70  Resp: 17 17 18 17   Temp: 98.9 F (37.2 C) 98.9 F (37.2 C) (!) 97.3 F (36.3 C) 98.7 F (37.1 C)  TempSrc: Oral Oral Oral Oral  SpO2: 90% 90% 90% 90%  Weight:      Height:        General: Pt is alert, awake, not in acute distress Cardiovascular: RRR, S1/S2 +, no rubs, no gallops Respiratory: CTA bilaterally, no wheezing, no rhonchi Abdominal: Soft, NT, ND, bowel sounds + Extremities: no edema, no cyanosis    The results of significant diagnostics from this hospitalization (including imaging, microbiology, ancillary and laboratory) are listed below for reference.     Microbiology: Recent Results (from the past 240 hour(s))  Resp Panel by RT-PCR (Flu A&B, Covid) Nasopharyngeal Swab     Status: None   Collection Time: 08/28/21  6:50 PM   Specimen: Nasopharyngeal Swab; Nasopharyngeal(NP) swabs in vial transport medium  Result Value Ref Range Status   SARS Coronavirus 2 by RT PCR NEGATIVE NEGATIVE Final    Comment: (NOTE) SARS-CoV-2 target nucleic acids are NOT DETECTED.  The SARS-CoV-2 RNA is generally detectable in upper respiratory specimens during the acute phase of infection. The lowest concentration of SARS-CoV-2 viral copies this assay can detect is 138 copies/mL. A negative result does not preclude SARS-Cov-2 infection and should not be used as the sole basis for treatment or other patient management decisions. A negative result may occur with  improper specimen collection/handling, submission of specimen other than nasopharyngeal swab, presence of viral mutation(s) within the areas targeted by this assay, and inadequate number of viral copies(<138 copies/mL). A negative result must be combined with clinical observations, patient history, and epidemiological information. The expected result is Negative.  Fact Sheet for Patients:  EntrepreneurPulse.com.au  Fact Sheet for Healthcare Providers:   IncredibleEmployment.be  This test is no t yet approved or cleared by the [place] FDA and  has been authorized for detection and/or diagnosis of SARS-CoV-2 by FDA under an Emergency Use Authorization (EUA). This EUA will remain  in effect (meaning this test can be used) for the duration of the COVID-19 declaration under Section 564(b)(1) of the Act, 21 U.S.C.section 360bbb-3(b)(1), unless the authorization is terminated  or revoked sooner.       Influenza A by PCR NEGATIVE NEGATIVE Final   Influenza B by PCR NEGATIVE NEGATIVE Final    Comment: (NOTE) The Xpert Xpress SARS-CoV-2/FLU/RSV plus assay is intended as an aid in the diagnosis of influenza from Nasopharyngeal swab specimens and should not be used as a sole basis for treatment. Nasal washings and aspirates are unacceptable for Xpert Xpress SARS-CoV-2/FLU/RSV testing.  Fact Sheet for Patients: EntrepreneurPulse.com.au  Fact Sheet for Healthcare Providers: IncredibleEmployment.be  This test is not yet approved or cleared by the [place] FDA and has been authorized for detection and/or diagnosis of SARS-CoV-2 by FDA under an Emergency Use Authorization (EUA). This EUA will remain in effect (meaning this test can be used) for the duration of the COVID-19 declaration under Section 564(b)(1) of the Act, 21 U.S.C.  section 360bbb-3(b)(1), unless the authorization is terminated or revoked.  Performed at [facility], [place] [address], [place], [place] [address]      Labs: BNP (last 3 results) No results for input(s): BNP in the last 8760 hours. Basic Metabolic Panel: Recent Labs  Lab 08/28/21 0740 08/28/21 1212 08/28/21 2018 08/29/21 0451 08/29/21 0854  NA 140 140  --  138  --   K 4.3 4.0  --  4.3  --   CL 104 105  --  100  --   CO2  --  25  --  27  --   GLUCOSE 175* 148*  --  165*  --   BUN 17 15  --  18  --   CREATININE 0.60  0.57 0.81 0.65  --   CALCIUM  --  8.0*  --  9.0  --   MG  --   --   --   --  1.9   Liver Function Tests: Recent Labs  Lab 08/28/21 1212 08/29/21 0451  AST 24 15  ALT 16 17  ALKPHOS 56 57  BILITOT 0.6 0.7  PROT 6.4* 6.7  ALBUMIN 3.7 3.9   No results for input(s): LIPASE, AMYLASE in the last 168 hours. No results for input(s): AMMONIA in the last 168 hours. CBC: Recent Labs  Lab 08/28/21 0740 08/28/21 1212 08/28/21 2018  WBC  --  8.7 11.0*  NEUTROABS  --  5.3  --   HGB 15.3* 13.7 13.6  HCT 45.0 42.1 43.0  MCV  --  90.9 92.3  PLT  --  227 242   Cardiac Enzymes: No results for input(s): CKTOTAL, CKMB, CKMBINDEX, TROPONINI in the last 168 hours. BNP: Invalid input(s): POCBNP CBG: Recent Labs  Lab 08/28/21 0934 08/28/21 2039 08/29/21 0744  GLUCAP 162* 180* 162*   D-Dimer Recent Labs    08/28/21 1313  DDIMER <0.27   Hgb A1c No results for input(s): HGBA1C in the last 72 hours. Lipid Profile No results for input(s): CHOL, HDL, LDLCALC, TRIG, CHOLHDL, LDLDIRECT in the last 72 hours. Thyroid function studies Recent Labs    08/28/21 2018  TSH 0.582   Anemia work up No results for input(s): VITAMINB12, FOLATE, FERRITIN, TIBC, IRON, RETICCTPCT in the last 72 hours. Urinalysis No results found for: COLORURINE, APPEARANCEUR, [place], [place], GLUCOSEU, [place], [place], [place], PROTEINUR, UROBILINOGEN, NITRITE, LEUKOCYTESUR Sepsis Labs Invalid input(s): PROCALCITONIN,  WBC,  LACTICIDVEN Microbiology Recent Results (from the past 240 hour(s))  Resp Panel by RT-PCR (Flu A&B, Covid) Nasopharyngeal Swab     Status: None   Collection Time: 08/28/21  6:50 PM   Specimen: Nasopharyngeal Swab; Nasopharyngeal(NP) swabs in vial transport medium  Result Value Ref Range Status   SARS Coronavirus 2 by RT PCR NEGATIVE NEGATIVE Final    Comment: (NOTE) SARS-CoV-2 target nucleic acids are NOT DETECTED.  The SARS-CoV-2 RNA is generally detectable in upper  respiratory specimens during the acute phase of infection. The lowest concentration of SARS-CoV-2 viral copies this assay can detect is 138 copies/mL. A negative result does not preclude SARS-Cov-2 infection and should not be used as the sole basis for treatment or other patient management decisions. A negative result may occur with  improper specimen collection/handling, submission of specimen other than nasopharyngeal swab, presence of viral mutation(s) within the areas targeted by this assay, and inadequate number of viral copies(<138 copies/mL). A negative result must be combined with clinical observations, patient history, and epidemiological information. The expected result is Negative.  Fact Sheet for Patients:  EntrepreneurPulse.com.au  Fact Sheet for Healthcare Providers:  IncredibleEmployment.be  This test is no t yet approved or cleared by the [place] FDA and  has been authorized for detection and/or diagnosis of SARS-CoV-2 by FDA under an Emergency Use Authorization (EUA). This EUA will remain  in effect (meaning this test can be used) for the duration of the COVID-19 declaration under Section 564(b)(1) of the Act, 21 U.S.C.section 360bbb-3(b)(1), unless the authorization is terminated  or revoked sooner.       Influenza A by PCR NEGATIVE NEGATIVE Final   Influenza B by PCR NEGATIVE NEGATIVE Final    Comment: (NOTE) The Xpert Xpress SARS-CoV-2/FLU/RSV plus assay is intended as an aid in the diagnosis of influenza from Nasopharyngeal swab specimens and should not be used as a sole basis for treatment. Nasal washings and aspirates are unacceptable for Xpert Xpress SARS-CoV-2/FLU/RSV testing.  Fact Sheet for Patients: EntrepreneurPulse.com.au  Fact Sheet for Healthcare Providers: IncredibleEmployment.be  This test is not yet approved or cleared by the [place] FDA and has been  authorized for detection and/or diagnosis of SARS-CoV-2 by FDA under an Emergency Use Authorization (EUA). This EUA will remain in effect (meaning this test can be used) for the duration of the COVID-19 declaration under Section 564(b)(1) of the Act, 21 U.S.C. section 360bbb-3(b)(1), unless the authorization is terminated or revoked.  Performed at [facility], [place] [address], [place], [place] [address]     SIGNED:   Charlynne Cousins, MD  [facility] 08/29/2021, 11:03 AM Pager   If 7PM-7AM, please contact night-coverage [email or web address] Password [email or web address]

## 2021-08-29 NOTE — Telephone Encounter (Signed)
-----   Message from Charlie Pitter, [place] sent at 08/29/2021 10:38 AM EST ----- Regarding: Help with sleep study? Karina Herman! It had been a while since I had to do this in the hospital but just wondering if you could help [place] arrange an outpatient sleep study on this patient for possible OSA. We saw her in the hospital for new SVT, found to drop her O2 sats when sleeping so diagnosis can be hypoxia or suspected OSA. I didn't even know how to put in the order from the hospital end so any help you can provide would be awesome.  I think you guys might have gotten a message about her yesterday for a monitor and f/u, but we ended up seeing her in the hospital here and Tiffany does not think she needs a monitor. I'll take care of arranging her f/u appt before she is discharged.  Thanks for your help!

## 2021-08-29 NOTE — Progress Notes (Signed)
Cardiology f/u arranged Wednesday Sep 24, 2021 10:05 AM. Also sent msg to Dr. Blenda Mounts nurse to help assist arranging OP sleep study. Await echo.

## 2021-08-29 NOTE — Progress Notes (Signed)
[facility] PROGRESS NOTE    Progress Note  Karina Herman  EQA:[id number] DOB: [birth year] DOA: 08/28/2021 PCP: Aura Dials, MD     Brief Narrative:   Karina Herman is an 71 y.o. female past medical history of diabetes mellitus recently had a soft tissue mass excision from her left upper extremity had SVT was sent to the ED twelve-lead EKG shows sinus tachycardia D-dimer was unremarkable cardiac biomarkers basically flat. Started on metoprolol cardiology consulted.    Assessment/Plan:   SVT (supraventricular tachycardia) (HCC) Currently on metoprolol her heart rate has been less than 100. 2D echo is pending. Cardiac biomarkers have basically remained flat. Awaiting cardiology consult.  Diabetes mellitus type 2, controlled ([place]) Continue current regimen glucose fairly controlled.   DVT prophylaxis: lovenox Family Communication:none Status is: Observation  The patient remains OBS appropriate and will d/c before 2 midnights.       Code Status:     Code Status Orders  (From admission, onward)           Start     Ordered   08/28/21 1954  Full code  Continuous        08/28/21 1953           Code Status History     Date Active Date Inactive Code Status Order ID Comments User Context   02/21/2019 1321 02/22/2019 1254 Full Code [id number]  Armandina Gemma, MD Inpatient         IV Access:   Peripheral IV   Procedures and diagnostic studies:   DG Chest Port 1 View  Result Date: 08/28/2021 CLINICAL DATA:  Pain, tachycardia EXAM: PORTABLE CHEST 1 VIEW COMPARISON:  02/17/2019 FINDINGS: The heart size and mediastinal contours are within normal limits. Chronic elevation of the right hemidiaphragm. Mild bibasilar subsegmental atelectasis. Lungs appear otherwise clear. No pleural effusion or pneumothorax. The visualized skeletal structures are unremarkable. IMPRESSION: No active disease. Electronically Signed   By: Davina Poke D.O.   On:  08/28/2021 13:03     Medical Consultants:   None.   Subjective:    Karina Herman denies any symptoms tolerating her diet.  Objective:    Vitals:   08/28/21 1933 08/28/21 2013 08/29/21 0027 08/29/21 0513  BP: 119/60 (!) 114/48 (!) 102/50 (!) 110/45  Pulse: (!) 101 98 90 99  Resp: (!) 22 17 17 18   Temp:  98.9 F (37.2 C) 98.9 F (37.2 C) (!) 97.3 F (36.3 C)  TempSrc:  Oral Oral Oral  SpO2: 92% 90% 90% 90%  Weight:      Height:       SpO2: 90 %   Intake/Output Summary (Last 24 hours) at 08/29/2021 4174 Last data filed at 08/29/2021 0515 Gross per 24 hour  Intake 940 ml  Output 5 ml  Net 935 ml   Filed Weights   08/28/21 1134  Weight: 78.5 kg    Exam: General exam: In no acute distress. Respiratory system: Good air movement and clear to auscultation. Cardiovascular system: S1 & S2 heard, RRR. No JVD. Gastrointestinal system: Abdomen is nondistended, soft and nontender.  Extremities: No pedal edema. Skin: No rashes, lesions or ulcers Psychiatry: Judgement and insight appear normal. Mood & affect appropriate.    Data Reviewed:    Labs: Basic Metabolic Panel: Recent Labs  Lab 08/28/21 0740 08/28/21 1212 08/28/21 2018 08/29/21 0451  NA 140 140  --  138  K 4.3 4.0  --  4.3  CL 104 105  --  100  CO2  --  25  --  27  GLUCOSE 175* 148*  --  165*  BUN 17 15  --  18  CREATININE 0.60 0.57 0.81 0.65  CALCIUM  --  8.0*  --  9.0   GFR Estimated Creatinine Clearance: 66.3 mL/min (by C-G formula based on SCr of 0.65 mg/dL). Liver Function Tests: Recent Labs  Lab 08/28/21 1212 08/29/21 0451  AST 24 15  ALT 16 17  ALKPHOS 56 57  BILITOT 0.6 0.7  PROT 6.4* 6.7  ALBUMIN 3.7 3.9   No results for input(s): LIPASE, AMYLASE in the last 168 hours. No results for input(s): AMMONIA in the last 168 hours. Coagulation profile No results for input(s): INR, PROTIME in the last 168 hours. COVID-19 Labs  Recent Labs    08/28/21 1313  DDIMER <0.27     Lab Results  Component Value Date   SARSCOV2NAA NEGATIVE 08/28/2021   [place] NEGATIVE 02/17/2019    CBC: Recent Labs  Lab 08/28/21 0740 08/28/21 1212 08/28/21 2018  WBC  --  8.7 11.0*  NEUTROABS  --  5.3  --   HGB 15.3* 13.7 13.6  HCT 45.0 42.1 43.0  MCV  --  90.9 92.3  PLT  --  227 242   Cardiac Enzymes: No results for input(s): CKTOTAL, CKMB, CKMBINDEX, TROPONINI in the last 168 hours. BNP (last 3 results) No results for input(s): PROBNP in the last 8760 hours. CBG: Recent Labs  Lab 08/28/21 0934 08/28/21 2039  GLUCAP 162* 180*   D-Dimer: Recent Labs    08/28/21 1313  DDIMER <0.27   Hgb A1c: No results for input(s): HGBA1C in the last 72 hours. Lipid Profile: No results for input(s): CHOL, HDL, LDLCALC, TRIG, CHOLHDL, LDLDIRECT in the last 72 hours. Thyroid function studies: Recent Labs    08/28/21 11-13-16  TSH 0.582   Anemia work up: No results for input(s): VITAMINB12, FOLATE, FERRITIN, TIBC, IRON, RETICCTPCT in the last 72 hours. Sepsis Labs: Recent Labs  Lab 08/28/21 1212 08/28/21 13-Nov-2016  WBC 8.7 11.0*   Microbiology Recent Results (from the past 240 hour(s))  Resp Panel by RT-PCR (Flu A&B, Covid) Nasopharyngeal Swab     Status: None   Collection Time: 08/28/21  6:50 PM   Specimen: Nasopharyngeal Swab; Nasopharyngeal(NP) swabs in vial transport medium  Result Value Ref Range Status   SARS Coronavirus 2 by RT PCR NEGATIVE NEGATIVE Final    Comment: (NOTE) SARS-CoV-2 target nucleic acids are NOT DETECTED.  The SARS-CoV-2 RNA is generally detectable in upper respiratory specimens during the acute phase of infection. The lowest concentration of SARS-CoV-2 viral copies this assay can detect is 138 copies/mL. A negative result does not preclude SARS-Cov-2 infection and should not be used as the sole basis for treatment or other patient management decisions. A negative result may occur with  improper specimen collection/handling, submission  of specimen other than nasopharyngeal swab, presence of viral mutation(s) within the areas targeted by this assay, and inadequate number of viral copies(<138 copies/mL). A negative result must be combined with clinical observations, patient history, and epidemiological information. The expected result is Negative.  Fact Sheet for Patients:  EntrepreneurPulse.com.au  Fact Sheet for Healthcare Providers:  IncredibleEmployment.be  This test is no t yet approved or cleared by the [place] FDA and  has been authorized for detection and/or diagnosis of SARS-CoV-2 by FDA under an Emergency Use Authorization (EUA). This EUA will remain  in effect (meaning this test can be used) for  the duration of the COVID-19 declaration under Section 564(b)(1) of the Act, 21 U.S.C.section 360bbb-3(b)(1), unless the authorization is terminated  or revoked sooner.       Influenza A by PCR NEGATIVE NEGATIVE Final   Influenza B by PCR NEGATIVE NEGATIVE Final    Comment: (NOTE) The Xpert Xpress SARS-CoV-2/FLU/RSV plus assay is intended as an aid in the diagnosis of influenza from Nasopharyngeal swab specimens and should not be used as a sole basis for treatment. Nasal washings and aspirates are unacceptable for Xpert Xpress SARS-CoV-2/FLU/RSV testing.  Fact Sheet for Patients: EntrepreneurPulse.com.au  Fact Sheet for Healthcare Providers: IncredibleEmployment.be  This test is not yet approved or cleared by the [place] FDA and has been authorized for detection and/or diagnosis of SARS-CoV-2 by FDA under an Emergency Use Authorization (EUA). This EUA will remain in effect (meaning this test can be used) for the duration of the COVID-19 declaration under Section 564(b)(1) of the Act, 21 U.S.C. section 360bbb-3(b)(1), unless the authorization is terminated or revoked.  Performed at [facility], [place]  [address], [place], [place] [address]      Medications:    dapagliflozin propanediol  10 mg Oral Daily   enoxaparin (LOVENOX) injection  40 mg Subcutaneous Q24H   insulin aspart  0-15 Units Subcutaneous TID WC   insulin aspart  0-5 Units Subcutaneous QHS   insulin aspart  3 Units Subcutaneous TID WC   levothyroxine  150 mcg Oral q morning   linagliptin  5 mg Oral Daily   metoprolol tartrate  50 mg Oral BID   senna  1 tablet Oral BID   simvastatin  10 mg Oral QHS   Continuous Infusions:    LOS: 0 days   Charlynne Cousins  [facility]  08/29/2021, 7:12 AM

## 2021-08-29 NOTE — Progress Notes (Signed)
Pt discharged home today per Dr. Aileen Fass. Pt's IV site D/C'd and WDL. Pt's VSS. Pt provided with home medication list, discharge instructions and prescriptions. Verbalized understanding. Pt left floor via WC in stable condition accompanied by NT.

## 2021-08-29 NOTE — Progress Notes (Signed)
° °  Echocardiogram 2D Echocardiogram has been performed.  Beryle Beams 08/29/2021, 12:17 PM

## 2021-08-29 NOTE — Plan of Care (Signed)
Problem: Education: Goal: Knowledge of General Education information will improve Description: Including pain rating scale, medication(s)/side effects and non-pharmacologic comfort measures Outcome: Completed/Met   Problem: Health Behavior/Discharge Planning: Goal: Ability to manage health-related needs will improve 08/29/2021 1405 by Annie Sable, RN Outcome: Completed/Met 08/29/2021 0936 by Annie Sable, RN Outcome: Progressing   Problem: Clinical Measurements: Goal: Ability to maintain clinical measurements within normal limits will improve 08/29/2021 1405 by Annie Sable, RN Outcome: Completed/Met 08/29/2021 0936 by Annie Sable, RN Outcome: Progressing Goal: Will remain free from infection Outcome: Completed/Met Goal: Diagnostic test results will improve 08/29/2021 1405 by Annie Sable, RN Outcome: Completed/Met 08/29/2021 0936 by Annie Sable, RN Outcome: Progressing Goal: Respiratory complications will improve Outcome: Completed/Met Goal: Cardiovascular complication will be avoided 08/29/2021 1405 by Annie Sable, RN Outcome: Completed/Met 08/29/2021 0936 by Annie Sable, RN Outcome: Progressing   Problem: Activity: Goal: Risk for activity intolerance will decrease 08/29/2021 1405 by Annie Sable, RN Outcome: Completed/Met 08/29/2021 0936 by Annie Sable, RN Outcome: Progressing   Problem: Nutrition: Goal: Adequate nutrition will be maintained Outcome: Completed/Met   Problem: Coping: Goal: Level of anxiety will decrease 08/29/2021 1405 by Annie Sable, RN Outcome: Completed/Met 08/29/2021 0936 by Annie Sable, RN Outcome: Progressing   Problem: Elimination: Goal: Will not experience complications related to bowel motility Outcome: Completed/Met Goal: Will not experience complications related to urinary retention Outcome: Completed/Met   Problem: Pain Managment: Goal: General experience of comfort will improve Outcome:  Completed/Met   Problem: Safety: Goal: Ability to remain free from injury will improve Outcome: Completed/Met   Problem: Skin Integrity: Goal: Risk for impaired skin integrity will decrease Outcome: Completed/Met

## 2021-08-29 NOTE — Consult Note (Signed)
Cardiology Consultation:   Patient ID: Karina Herman MRN: [id number]; DOB: [birth year]  Admit date: 08/28/2021 Date of Consult: 08/29/2021  PCP:  Aura Dials, MD   [facility] Providers Cardiologist:  New Click here to update MD or APP on Care Team, Refresh:1}     Patient Profile:   Karina Herman is a 71 y.o. female with a hx of DM, GERD, HLD, arthritis, thyroid disease s/p prior thyroidectomy, very remote tobacco use (quit 81 y/a) who is being seen 08/29/2021 for the evaluation of SVT at the request of Dr. Aileen Fass.  History of Present Illness:   Ms. Wanner has no prior cardiac history. Looking back at HR trends it has not been uncommon for her to be around the 90s-low 100s (I.e. HR 111 on 06/12/21). She was recently referred to general surgery by PCP for enlarging soft tissue mass on her arm for several years with gradual enlargement. She underwent excision yesterday, pathology pending. Intra-op and post-op, she was found to have episodic runs of tachycardia with HR in the 150s suggestive of SVT. She could periodically feel these as a tightness/fullness in her upper chest/throat area. She denies any overt chest pain. She reports other times she was not able to feel it while it was happening. When first EKG was captured she was in NSR. However, the very last tracing was able to capture tail end of tachycardia; appears to be a narrow complex SVT, no overt stigmata of flutter. She was subsequently sent to the ED for evaluation. Labs show normal troponins, d-dimer, potassium, Hgb, and TSH. 2D echo is pending. She received metoprolol 25mg  yesterday. She had brief episode yesterday evening captured on telemetry but none since that time. She feels great this AM. In retrospect she does recall potentially feeling a similar short-lived feeling in the past but it has not been particularly lifestyle limiting. She was noted to be hypoxic while sleeping in PACU and has not had prior eval for  OSA. Regarding fam hx, her mother had valve replacement at age 70. Denies tobacco/ETOH (remote smoker). 2D echo pending.   Past Medical History:  Diagnosis Date   Arm mass, left 08/25/2021   left upper   Arthritis    sorriatic arthritis pt thinks   Concussion    15 years ago no residual per pt on 08-25-2021   DM Type 2    GERD (gastroesophageal reflux disease)    diet controlled   High cholesterol    Hypothyroidism    Wears glasses     Past Surgical History:  Procedure Laterality Date   APPENDECTOMY     age 28 or 31   OVARIAN CYST REMOVAL  2004   THYROIDECTOMY N/A 02/21/2019   Procedure: TOTAL THYROIDECTOMY;  Surgeon: Armandina Gemma, MD;  Location: [facility];  Service: General;  Laterality: N/A;     Home Medications:  Prior to Admission medications   Medication Sig Start Date End Date Taking? Authorizing Provider  acetaminophen (TYLENOL) 500 MG tablet Take 1,000 mg by mouth every 6 (six) hours as needed for mild pain.   Yes [provider]  dapagliflozin propanediol (FARXIGA) 10 MG TABS tablet Take by mouth daily.   Yes [provider]  ibuprofen (ADVIL) 200 MG tablet Take 400 mg by mouth every 6 (six) hours as needed for mild pain or headache.   Yes [provider]  levothyroxine (SYNTHROID) 150 MCG tablet Take 150 mcg by mouth every morning. 06/05/21  Yes [provider]  metFORMIN (GLUCOPHAGE)  1000 MG tablet Take 1,000 mg by mouth 2 (two) times a day. 08/19/18  Yes [provider]  simvastatin (ZOCOR) 10 MG tablet Take 10 mg by mouth at bedtime.   Yes [provider]  sitaGLIPtin (JANUVIA) 50 MG tablet Take 50 mg by mouth daily.   Yes [provider]  traMADol (ULTRAM) 50 MG tablet Take 1-2 tablets (50-100 mg total) by mouth every 6 (six) hours as needed for moderate pain. 08/28/21   Armandina Gemma, MD    Inpatient Medications: Scheduled Meds:  dapagliflozin propanediol  10 mg Oral Daily   enoxaparin (LOVENOX) injection   40 mg Subcutaneous Q24H   insulin aspart  0-15 Units Subcutaneous TID WC   insulin aspart  0-5 Units Subcutaneous QHS   insulin aspart  3 Units Subcutaneous TID WC   levothyroxine  150 mcg Oral q morning   linagliptin  5 mg Oral Daily   metoprolol tartrate  25 mg Oral BID   senna  1 tablet Oral BID   simvastatin  10 mg Oral QHS   Continuous Infusions:  PRN Meds: acetaminophen, ibuprofen, metoprolol tartrate, ondansetron **OR** ondansetron (ZOFRAN) IV, traMADol  Allergies:   No Known Allergies  Social History:   Social History   Socioeconomic History   Marital status: Married    Spouse name: Not on file   Number of children: Not on file   Years of education: Not on file   Highest education level: Not on file  Occupational History   Not on file  Tobacco Use   Smoking status: Former    Packs/day: 0.50    Years: 15.00    Pack years: 7.50    Types: Cigarettes    Quit date: 1993    Years since quitting: 30.0   Smokeless tobacco: Never  Vaping Use   Vaping Use: Never used  Substance and Sexual Activity   Alcohol use: Not Currently   Drug use: Never   Sexual activity: Not on file  Other Topics Concern   Not on file  Social History Narrative   Not on file   Social Determinants of Health   Financial Resource Strain: Not on file  Food Insecurity: Not on file  Transportation Needs: Not on file  Physical Activity: Not on file  Stress: Not on file  Social Connections: Not on file  Intimate Partner Violence: Not on file    Family History:    Family History  Problem Relation Age of Onset   Valvular heart disease Mother        valve replacement age 68     ROS:  Please see the history of present illness.  All other ROS reviewed and negative.     Physical Exam/Data:   Vitals:   08/28/21 2013 08/29/21 0027 08/29/21 0513 08/29/21 0700  BP: (!) 114/48 (!) 102/50 (!) 110/45 (!) 112/56  Pulse: 98 90 99 70  Resp: 17 17 18 17   Temp: 98.9 F (37.2 C) 98.9 F (37.2  C) (!) 97.3 F (36.3 C) 98.7 F (37.1 C)  TempSrc: Oral Oral Oral Oral  SpO2: 90% 90% 90% 90%  Weight:      Height:        Intake/Output Summary (Last 24 hours) at 08/29/2021 0916 Last data filed at 08/29/2021 0515 Gross per 24 hour  Intake 940 ml  Output --  Net 940 ml   Last 3 Weights 08/28/2021 08/28/2021 08/25/2021  Weight (lbs) 173 lb 173 lb 4.8 oz 176 lb  Weight (  kg) 78.472 kg 78.608 kg 79.833 kg     Body mass index is 29.7 kg/m.  General: Well developed, well nourished WM in no acute distress. Head: Normocephalic, atraumatic, sclera non-icteric, no xanthomas, nares are without discharge. Neck: Negative for carotid bruits. JVP not elevated. Lungs: Clear bilaterally to auscultation without wheezes, rales, or rhonchi. Breathing is unlabored. Heart: RRR S1 S2 without murmurs, rubs, or gallops.  Abdomen: Soft, non-tender, non-distended with normoactive bowel sounds. No rebound/guarding. Extremities: No clubbing or cyanosis. No edema. Distal pedal pulses are 2+ and equal bilaterally. L arm incision c/d/i Neuro: Alert and oriented X 3. Moves all extremities spontaneously. Psych:  Responds to questions appropriately with a normal affect.   EKG:  The EKG was personally reviewed and demonstrates:  NSR 90bpm, low voltage QRS, cannot r/o prior anteroseptal infarct based on appearance of precordial leads, normal QTc F/u EKG in PM was able to capture tail end of tachycardia but confounded by baseline wander, HR around 150  Telemetry:  Telemetry was personally reviewed and demonstrates:  outlined above  Relevant CV Studies: pending  Laboratory Data:  High Sensitivity Troponin:   Recent Labs  Lab 08/28/21 1212 08/28/21 1422 08/28/21 2018 08/28/21 2145  TROPONINIHS 2 3 3 3      Chemistry Recent Labs  Lab 08/28/21 0740 08/28/21 1212 08/28/21 2018 08/29/21 0451  NA 140 140  --  138  K 4.3 4.0  --  4.3  CL 104 105  --  100  CO2  --  25  --  27  GLUCOSE 175* 148*  --  165*   BUN 17 15  --  18  CREATININE 0.60 0.57 0.81 0.65  CALCIUM  --  8.0*  --  9.0  GFRNONAA  --  >60 >60 >60  ANIONGAP  --  10  --  11    Recent Labs  Lab 08/28/21 1212 08/29/21 0451  PROT 6.4* 6.7  ALBUMIN 3.7 3.9  AST 24 15  ALT 16 17  ALKPHOS 56 57  BILITOT 0.6 0.7   Lipids No results for input(s): CHOL, TRIG, HDL, LABVLDL, LDLCALC, CHOLHDL in the last 168 hours.  Hematology Recent Labs  Lab 08/28/21 0740 08/28/21 1212 08/28/21 2018  WBC  --  8.7 11.0*  RBC  --  4.63 4.66  HGB 15.3* 13.7 13.6  HCT 45.0 42.1 43.0  MCV  --  90.9 92.3  MCH  --  29.6 29.2  MCHC  --  32.5 31.6  RDW  --  13.6 13.8  PLT  --  227 242   Thyroid  Recent Labs  Lab 08/28/21 2018  TSH 0.582    BNPNo results for input(s): BNP, PROBNP in the last 168 hours.  DDimer  Recent Labs  Lab 08/28/21 1313  DDIMER <0.27     Radiology/Studies:  DG Chest Port 1 View  Result Date: 08/28/2021 CLINICAL DATA:  Pain, tachycardia EXAM: PORTABLE CHEST 1 VIEW COMPARISON:  02/17/2019 FINDINGS: The heart size and mediastinal contours are within normal limits. Chronic elevation of the right hemidiaphragm. Mild bibasilar subsegmental atelectasis. Lungs appear otherwise clear. No pleural effusion or pneumothorax. The visualized skeletal structures are unremarkable. IMPRESSION: No active disease. Electronically Signed   By: Davina Poke D.O.   On: 08/28/2021 13:03     Assessment and Plan:   1. Paroxysmal SVT - 08/28/21 13:17 EKG and telemetry around 2100 overnight best capture the brief arrhythmia, felt at this time to reflect SVT, but will review with MD to discuss  potential atrial flutter - baseline HR appears 90s-100s back to 2020, so current HR of 103 is not far from baseline - initially written to start metoprolol 50mg  BID but since BP has been soft at times, will initiate at dose of 25mg  BID to start - echo pending  2. Suspected OSA - have suggested to patient that she discuss sleep study as  outpatient  3. Thyroid disease - TSH wnl  4. Hyperlipidemia - continue usual f/u PCP  Risk Assessment/Risk Scores:                For questions or updates, please contact [place] Please consult [email or web address] for contact info under    Signed, Charlie Pitter, PA-C  08/29/2021 9:16 AM

## 2021-08-29 NOTE — Plan of Care (Signed)
Problem: Education: Goal: Knowledge of General Education information will improve Description: Including pain rating scale, medication(s)/side effects and non-pharmacologic comfort measures Outcome: Completed/Met   Problem: Health Behavior/Discharge Planning: Goal: Ability to manage health-related needs will improve Outcome: Progressing   Problem: Clinical Measurements: Goal: Ability to maintain clinical measurements within normal limits will improve Outcome: Progressing Goal: Will remain free from infection Outcome: Completed/Met Goal: Diagnostic test results will improve Outcome: Progressing Goal: Respiratory complications will improve Outcome: Completed/Met Goal: Cardiovascular complication will be avoided Outcome: Progressing   Problem: Activity: Goal: Risk for activity intolerance will decrease Outcome: Progressing   Problem: Nutrition: Goal: Adequate nutrition will be maintained Outcome: Completed/Met   Problem: Coping: Goal: Level of anxiety will decrease Outcome: Progressing   Problem: Elimination: Goal: Will not experience complications related to bowel motility Outcome: Completed/Met Goal: Will not experience complications related to urinary retention Outcome: Completed/Met   Problem: Pain Managment: Goal: General experience of comfort will improve Outcome: Completed/Met   Problem: Safety: Goal: Ability to remain free from injury will improve Outcome: Completed/Met   Problem: Skin Integrity: Goal: Risk for impaired skin integrity will decrease Outcome: Completed/Met

## 2021-08-30 LAB — HEMOGLOBIN A1C
Hgb A1c MFr Bld: 7.6 % — ABNORMAL HIGH (ref 4.8–5.6)
Mean Plasma Glucose: 171 mg/dL

## 2021-09-03 ENCOUNTER — Telehealth: Payer: Self-pay | Admitting: Cardiovascular Disease

## 2021-09-03 DIAGNOSIS — R0902 Hypoxemia: Secondary | ICD-10-CM

## 2021-09-03 DIAGNOSIS — R0683 Snoring: Secondary | ICD-10-CM

## 2021-09-03 NOTE — Telephone Encounter (Signed)
She has to have pulmonary issues like COPD, taking opiates or a failed HST etc. Her insurance does not require a PA for a HST.

## 2021-09-03 NOTE — Progress Notes (Signed)
Final path benign.  Will have copy of path report for patient at office visit post op.  Claiborne Billings - please notify patient path is benign.  [place], MD [facility] [place] practice Office: [phone]

## 2021-09-03 NOTE — Telephone Encounter (Signed)
Patient denied for sleep study how would you like to proceed?

## 2021-09-03 NOTE — Telephone Encounter (Signed)
Sleep study isnt authorized.. patient doesn't meet criteria.. please advise if wanting to appeal.

## 2021-09-03 NOTE — Telephone Encounter (Signed)
Snoring and nocturnal hypoxia?  What are their criteria?  Will they allow a home sleep study?    TCR   Will forward to sleep pool for review

## 2021-09-04 ENCOUNTER — Telehealth (HOSPITAL_BASED_OUTPATIENT_CLINIC_OR_DEPARTMENT_OTHER): Payer: Self-pay

## 2021-09-04 DIAGNOSIS — G4734 Idiopathic sleep related nonobstructive alveolar hypoventilation: Secondary | ICD-10-CM

## 2021-09-04 DIAGNOSIS — R0683 Snoring: Secondary | ICD-10-CM

## 2021-09-04 NOTE — Telephone Encounter (Addendum)
Home sleep study ordered for patient!     ----- Message from Freada Bergeron, Doctor Phillips sent at 09/04/2021  9:02 AM EST ----- Regarding: RE: Patient Sleep Study Denied Yes, there is no prior Auth for home test for [employer]. ----- Message ----- From: Gerald Stabs, RN Sent: 09/03/2021   1:25 PM EST To: Cv Div Sleep Studies Subject: Patient Sleep Study Denied                     Hey y'all, this patient called in saying they were denied for their sleep study, can you help me?   They have snoring and nocturnal hypoxia, would they qualify for a home test? Let me know your thoughts!   Thanks, Daphene Jaeger!

## 2021-09-04 NOTE — Telephone Encounter (Signed)
Per Mariann Laster, "She has to have pulmonary issues like COPD, taking opiates or a failed HST etc. Her insurance does not require a PA for a HST." Please advise how you would like to proceed!

## 2021-09-05 NOTE — Telephone Encounter (Signed)
[place] for home sleep study per Dr Oval Linsey   Order placed and sent to sleep pool

## 2021-09-23 NOTE — Progress Notes (Signed)
Office Visit    Patient Name: Karina Herman Date of Encounter: 09/24/2021  PCP:  Aura Dials, MD   [place]  Cardiologist:  Skeet Latch, MD  Advanced Practice Provider:  No care team member to display Electrophysiologist:  None    HPI    Karina Herman is a 71 y.o. female with a hx of GERD, arthritis, thyroid disease status post prior thyroidectomy, remote tobacco use (quit 30 years ago) SVT, and DM presents today for hospital follow-up.  She was seen in the hospital by Sharrell Ku, PA-C.  She has no previous cardiac history.  Usually her heart rate is around the 90s, low 100s.  She was recently referred to general surgery by PCP for enlarging soft tissue mass on her arm for several years with gradual enlargement.  She underwent excision while in the hospital in January.  She was found to have episodic runs of tachycardia with heart rate in the 150s suggestive of SVT.  She was having tightness/fullness in her upper chest and throat area periodically.  She denied any overt chest pain.  There was a EKG which showed narrow complex SVT, no overt stigmata of flutter.  She was sent to the ED for evaluation.  Labs showed normal troponins, D-dimer, potassium, hemoglobin, and TSH.  2D echocardiogram showed normal ejection fraction of 60 to 65%, all visualized valves were normal. She received metoprolol 25 mg.  She had a brief episode which was captured on telemetry.  She was noted to be hypoxic while sleeping in the PACU and had not had prior evaluation for OSA.  She was originally started on metoprolol 50 mg twice daily but since her blood pressure was soft at times we started her at 25 mg twice daily.  Today, she states that she has had no symptoms since admission.  When she was in the ED she did have a very light sensation in the back of her throat when the SVT started to happen.  She has not experienced this same feeling.  She has been taking her metoprolol  25 mg twice daily without any issues.  She did have a dizzy spell back in December but has not had any dizziness since.  She is interested in losing some weight and we have discussed the prep program with her through the [facility].  Otherwise, she is doing well.  Reports no shortness of breath nor dyspnea on exertion. Reports no chest pain, pressure, or tightness. No edema, orthopnea, PND. Reports no palpitations.    Past Medical History    Past Medical History:  Diagnosis Date   Arm mass, left 08/25/2021   left upper   Arthritis    sorriatic arthritis pt thinks   Concussion    15 years ago no residual per pt on 08-25-2021   DM Type 2    GERD (gastroesophageal reflux disease)    diet controlled   High cholesterol    Hypothyroidism    Wears glasses    Past Surgical History:  Procedure Laterality Date   APPENDECTOMY     age 82 or 59   EXCISION MASS UPPER EXTREMETIES Left 08/28/2021   Procedure: EXCISION SOFT TISSUE MASS LEFT UPPER ARM;  Surgeon: Armandina Gemma, MD;  Location: [facility];  Service: General;  Laterality: Left;   OVARIAN CYST REMOVAL  2004   THYROIDECTOMY N/A 02/21/2019   Procedure: TOTAL THYROIDECTOMY;  Surgeon: Armandina Gemma, MD;  Location: [place];  Service: General;  Laterality: N/A;    Allergies  No Known Allergies   EKGs/Labs/Other Studies Reviewed:   The following studies were reviewed today:  Echocardiogram 08/29/2021  IMPRESSIONS     1. Left ventricular ejection fraction, by estimation, is 60 to 65%. The  left ventricle has normal function. The left ventricle has no regional  wall motion abnormalities. There is mild concentric left ventricular  hypertrophy. Left ventricular diastolic  parameters were normal.   2. Right ventricular systolic function is normal. The right ventricular  size is normal.   3. The mitral valve is normal in structure. No evidence of mitral valve  regurgitation. No evidence of mitral stenosis.   4. The aortic valve  is normal in structure. Aortic valve regurgitation is  not visualized. No aortic stenosis is present.   5. The inferior vena cava is normal in size with greater than 50%  respiratory variability, suggesting right atrial pressure of 3 mmHg.   EKG:  EKG is not ordered today  Recent Labs: 08/28/2021: Hemoglobin 13.6; Platelets 242; TSH 0.582 08/29/2021: ALT 17; BUN 18; Creatinine, Ser 0.65; Magnesium 1.9; Potassium 4.3; Sodium 138  Recent Lipid Panel No results found for: CHOL, TRIG, HDL, CHOLHDL, VLDL, LDLCALC, LDLDIRECT  Home Medications   Current Meds  Medication Sig   acetaminophen (TYLENOL) 500 MG tablet Take 1,000 mg by mouth every 6 (six) hours as needed for mild pain.   dapagliflozin propanediol (FARXIGA) 10 MG TABS tablet Take by mouth daily.   ibuprofen (ADVIL) 200 MG tablet Take 400 mg by mouth every 6 (six) hours as needed for mild pain or headache.   levothyroxine (SYNTHROID) 150 MCG tablet Take 150 mcg by mouth every morning.   metFORMIN (GLUCOPHAGE) 1000 MG tablet Take 1,000 mg by mouth 2 (two) times a day.   simvastatin (ZOCOR) 10 MG tablet Take 10 mg by mouth at bedtime.   sitaGLIPtin (JANUVIA) 50 MG tablet Take 50 mg by mouth daily.   [DISCONTINUED] metoprolol tartrate (LOPRESSOR) 25 MG tablet Take 1 tablet (25 mg total) by mouth 2 (two) times daily.     Review of Systems      All other systems reviewed and are otherwise negative except as noted above.  Physical Exam    VS:  BP 132/72    Pulse 89    Ht 5' 3.5" (1.613 m)    Wt 175 lb 12.8 oz (79.7 kg)    SpO2 95%    BMI 30.65 kg/m  , BMI Body mass index is 30.65 kg/m.  Wt Readings from Last 3 Encounters:  09/24/21 175 lb 12.8 oz (79.7 kg)  08/28/21 173 lb (78.5 kg)  08/28/21 173 lb 4.8 oz (78.6 kg)     GEN: Well nourished, well developed, in no acute distress. HEENT: normal. Neck: Supple, no JVD, carotid bruits, or masses. Cardiac: RRR, no murmurs, rubs, or gallops. No clubbing, cyanosis, edema.  Radials/PT  2+ and equal bilaterally.  Respiratory:  Respirations regular and unlabored, clear to auscultation bilaterally. GI: Soft, nontender, nondistended. MS: No deformity or atrophy. Skin: Warm and dry, no rash. Neuro:  Strength and sensation are intact. Psych: Normal affect.  Assessment & Plan    Paroxysmal SVT -Metoprolol 25mg  BID, we have sent a refill of this medication  -She did have a faint sensation in her throat when she was having episode in the ED -She has not had any symptoms since her hospitalization  Suspected OSA -Sleep study set up for March 1st  Thyroid disease -Recent TSH  0.582  Hyperlipidemia -She sees Thayer Headings who is an APP -usually drawn once a year   Disposition: Follow up 6 months with Skeet Latch, MD or APP.  Signed, Elgie Collard, PA-C 09/24/2021, 10:40 AM [place]

## 2021-09-24 ENCOUNTER — Encounter (HOSPITAL_BASED_OUTPATIENT_CLINIC_OR_DEPARTMENT_OTHER): Payer: Self-pay | Admitting: Physician Assistant

## 2021-09-24 ENCOUNTER — Other Ambulatory Visit: Payer: Self-pay

## 2021-09-24 ENCOUNTER — Ambulatory Visit (HOSPITAL_BASED_OUTPATIENT_CLINIC_OR_DEPARTMENT_OTHER): Payer: Medicare PPO | Admitting: Physician Assistant

## 2021-09-24 VITALS — BP 132/72 | HR 89 | Ht 63.5 in | Wt 175.8 lb

## 2021-09-24 DIAGNOSIS — I471 Supraventricular tachycardia, unspecified: Secondary | ICD-10-CM

## 2021-09-24 DIAGNOSIS — R0902 Hypoxemia: Secondary | ICD-10-CM

## 2021-09-24 DIAGNOSIS — R0683 Snoring: Secondary | ICD-10-CM | POA: Diagnosis not present

## 2021-09-24 DIAGNOSIS — E782 Mixed hyperlipidemia: Secondary | ICD-10-CM

## 2021-09-24 MED ORDER — METOPROLOL TARTRATE 25 MG PO TABS: 25.0000 mg | ORAL_TABLET | Freq: Two times a day (BID) | ORAL | 3 refills | Status: AC

## 2021-09-24 NOTE — Patient Instructions (Signed)
Medication Instructions:  Your Physician recommend you continue on your current medication as directed.    *If you need a refill on your cardiac medications before your next appointment, please call your pharmacy*   Follow-Up: At [facility], you and your health needs are our priority.  As part of our continuing mission to provide you with exceptional heart care, we have created designated Provider Care Teams.  These Care Teams include your primary Cardiologist (physician) and Advanced Practice Providers (APPs -  Physician Assistants and Nurse Practitioners) who all work together to provide you with the care you need, when you need it.  We recommend signing up for the patient portal called "MyChart".  Sign up information is provided on this After Visit Summary.  MyChart is used to connect with patients for Virtual Visits (Telemedicine).  Patients are able to view lab/test results, encounter notes, upcoming appointments, etc.  Non-urgent messages can be sent to your provider as well.   To learn more about what you can do with MyChart, go to [email or web address].    Your next appointment:   6 month(s)  The format for your next appointment:   In Person  Provider:   Skeet Latch, MD{  Other Instructions We have sent you in a referral to the PREP Program.

## 2021-09-25 NOTE — Telephone Encounter (Signed)
Per Tessa's ov 2/22 patient doing sleep study 3/1

## 2021-09-26 ENCOUNTER — Telehealth: Payer: Self-pay

## 2021-09-26 NOTE — Telephone Encounter (Signed)
Called to discuss PREP program, left voicemail ? ?

## 2021-10-01 ENCOUNTER — Ambulatory Visit (HOSPITAL_BASED_OUTPATIENT_CLINIC_OR_DEPARTMENT_OTHER): Payer: Medicare PPO | Attending: Cardiovascular Disease | Admitting: Cardiovascular Disease

## 2021-10-01 ENCOUNTER — Encounter (INDEPENDENT_AMBULATORY_CARE_PROVIDER_SITE_OTHER): Payer: Self-pay

## 2021-10-01 ENCOUNTER — Other Ambulatory Visit: Payer: Self-pay

## 2021-10-01 DIAGNOSIS — G4734 Idiopathic sleep related nonobstructive alveolar hypoventilation: Secondary | ICD-10-CM

## 2021-10-01 DIAGNOSIS — G4733 Obstructive sleep apnea (adult) (pediatric): Secondary | ICD-10-CM | POA: Diagnosis not present

## 2021-10-01 DIAGNOSIS — R0683 Snoring: Secondary | ICD-10-CM | POA: Diagnosis not present

## 2021-10-10 ENCOUNTER — Encounter (HOSPITAL_BASED_OUTPATIENT_CLINIC_OR_DEPARTMENT_OTHER): Payer: Self-pay | Admitting: Cardiovascular Disease

## 2021-10-10 NOTE — Procedures (Signed)
? ? ? ?  Patient Name: Karina Herman, Karina Herman ?Study Date: 10/01/2021 ?Gender: Female ?D.O.B: 1950/08/15 ?Age (years): 81 ?Referring Provider: Skeet Latch ?Height (inches): 63 ?Interpreting Physician: Shelva Majestic MD, ABSM ?Weight (lbs): 173 ?RPSGT: Karina Herman Reedy ?BMI: 31 ?MRN: [id number] ?Neck Size: 14.00 ? ?CLINICAL INFORMATION ?Sleep Study Type: HST ? ?Indication for sleep study: nocturnal hypoxia, ? ?Epworth Sleepiness Score: 4 ? ?SLEEP STUDY TECHNIQUE ?A multi-channel overnight portable sleep study was performed. The channels recorded were: nasal airflow, thoracic respiratory movement, and oxygen saturation with a pulse oximetry. Snoring was also monitored. ? ?MEDICATIONS ?acetaminophen (TYLENOL) 500 MG tablet ?dapagliflozin propanediol (FARXIGA) 10 MG TABS tablet ?ibuprofen (ADVIL) 200 MG tablet ?levothyroxine (SYNTHROID) 150 MCG tablet ?metFORMIN (GLUCOPHAGE) 1000 MG tablet ?metoprolol tartrate (LOPRESSOR) 25 MG tablet ?simvastatin (ZOCOR) 10 MG tablet ?sitaGLIPtin (JANUVIA) 50 MG tablet ?traMADol (ULTRAM) 50 MG tablet ?Patient self administered medications include: N/A. ? ?SLEEP ARCHITECTURE ?Patient was studied for 368.5 minutes. The sleep efficiency was 100.0 % and the patient was supine for 3.1%. The arousal index was 0.0 per hour. ? ?RESPIRATORY PARAMETERS ?The overall AHI was 25.2 per hour, with a central apnea index of 0 per hour. ? ?The oxygen nadir was 83% during sleep. Time spent < 89% was 26 minutes. ? ?CARDIAC DATA ?Mean heart rate during sleep was 86.3 bpm. ? ?IMPRESSIONS ?- Moderate obstructive sleep apnea occurred during this study (AHI 25.2/h). ?- Moderate oxygen desaturation to a nadir of 83%. ?- Patient snored 0.3% during the sleep. ? ?DIAGNOSIS ?- Obstructive Sleep Apnea (G47.33) ?- Nocturnal Hypoxemia (G47.36) ? ?RECOMMENDATIONS ?- Recommend therapeutic CPAP for treatment of the patient's moderate sleep apnea.  If unable to obtain an in-lab titration initiate Auto-PAP with EPR of 3 at  7 - 18 cm of water. ?- Effort should be made to optimize nasal and oropharyngeal patency. ?- Avoid alcohol, sedatives and other CNS depressants that may worsen sleep apnea and disrupt normal sleep architecture. ?- Sleep hygiene should be reviewed to assess factors that may improve sleep quality. ?- Weight management (BMI 31) and regular exercise should be initiated or continued. ?- Recommend a download and sleep clinic evaluation after one month of therapy.  ? ? ?[Electronically signed] 10/10/2021 09:56 AM ? ?Shelva Majestic MD, [facility], ABSM ?[place], [place] Board of Sleep Medicine ? ? ?NPI: [id number] ?[place] ?PH: [phone]   FX: [phone] ?ACCREDITED BY THE AMERICAN ACADEMY OF SLEEP MEDICINE ? ?

## 2021-10-15 ENCOUNTER — Telehealth: Payer: Self-pay | Admitting: *Deleted

## 2021-10-15 NOTE — Telephone Encounter (Signed)
-----   Message from Troy Sine, MD sent at 10/10/2021 10:01 AM EST ----- ?Mariann Laster, please notify pt- if unable for in-lab titration, initiate Auto-PAP ?

## 2021-10-15 NOTE — Telephone Encounter (Signed)
Left message to return a call to discuss HST. 

## 2021-10-16 ENCOUNTER — Other Ambulatory Visit: Payer: Self-pay | Admitting: Cardiovascular Disease

## 2021-10-16 NOTE — Telephone Encounter (Signed)
Patient returned a call and was given her HST results and recommendations. She agrees to proceed with titration per [facility] approval. ?

## 2021-10-17 ENCOUNTER — Telehealth: Payer: Self-pay | Admitting: *Deleted

## 2021-10-17 NOTE — Telephone Encounter (Signed)
CPAP titration appointment details left on voice mail. ?

## 2021-10-23 DIAGNOSIS — E1169 Type 2 diabetes mellitus with other specified complication: Secondary | ICD-10-CM | POA: Diagnosis not present

## 2021-10-23 DIAGNOSIS — E7800 Pure hypercholesterolemia, unspecified: Secondary | ICD-10-CM | POA: Diagnosis not present

## 2021-10-23 DIAGNOSIS — E039 Hypothyroidism, unspecified: Secondary | ICD-10-CM | POA: Diagnosis not present

## 2021-10-23 DIAGNOSIS — Z87898 Personal history of other specified conditions: Secondary | ICD-10-CM | POA: Diagnosis not present

## 2021-11-17 ENCOUNTER — Ambulatory Visit (HOSPITAL_BASED_OUTPATIENT_CLINIC_OR_DEPARTMENT_OTHER): Payer: Medicare PPO | Attending: Cardiovascular Disease | Admitting: Cardiovascular Disease

## 2021-11-17 DIAGNOSIS — G4736 Sleep related hypoventilation in conditions classified elsewhere: Secondary | ICD-10-CM | POA: Insufficient documentation

## 2021-11-17 DIAGNOSIS — G4733 Obstructive sleep apnea (adult) (pediatric): Secondary | ICD-10-CM | POA: Diagnosis not present

## 2021-11-18 ENCOUNTER — Encounter (HOSPITAL_BASED_OUTPATIENT_CLINIC_OR_DEPARTMENT_OTHER): Payer: Medicare PPO | Admitting: Cardiovascular Disease

## 2021-11-21 ENCOUNTER — Encounter (HOSPITAL_BASED_OUTPATIENT_CLINIC_OR_DEPARTMENT_OTHER): Payer: Self-pay | Admitting: Cardiovascular Disease

## 2021-11-21 ENCOUNTER — Telehealth: Payer: Self-pay | Admitting: *Deleted

## 2021-11-21 NOTE — Procedures (Signed)
? ? ? ? ?Patient Name: Karina Herman, Karina Herman ?Study Date: 11/17/2021 ?Gender: Female ?D.O.B: 05/15/1951 ?Age (years): 42 ?Referring Provider: Skeet Latch ?Height (inches): 64 ?Interpreting Physician: Shelva Majestic MD, ABSM ?Weight (lbs): 173 ?RPSGT: Laren Everts ?BMI: 30 ?MRN: [id number] ?Neck Size: 15.00 ? ?CLINICAL INFORMATION ?The patient is referred for a CPAP titration to treat sleep apnea. ? ?Date of HST: 10/01/2021: AHI 25.2/h; O2 nadir 83%. ? ?SLEEP STUDY TECHNIQUE ?As per the AASM Manual for the Scoring of Sleep and Associated Events v2.3 (April 2016) with a hypopnea requiring 4% desaturations. ? ?The channels recorded and monitored were frontal, central and occipital EEG, electrooculogram (EOG), submentalis EMG (chin), nasal and oral airflow, thoracic and abdominal wall motion, anterior tibialis EMG, snore microphone, electrocardiogram, and pulse oximetry. Continuous positive airway pressure (CPAP) was initiated at the beginning of the study and titrated to treat sleep-disordered breathing. ? ?MEDICATIONS ?acetaminophen (TYLENOL) 500 MG tablet ?dapagliflozin propanediol (FARXIGA) 10 MG TABS tablet ?ibuprofen (ADVIL) 200 MG tablet ?levothyroxine (SYNTHROID) 150 MCG tablet ?metFORMIN (GLUCOPHAGE) 1000 MG tablet ?metoprolol tartrate (LOPRESSOR) 25 MG tablet ?simvastatin (ZOCOR) 10 MG tablet ?sitaGLIPtin (JANUVIA) 50 MG tablet ?traMADol (ULTRAM) 50 MG tablet ?Medications self-administered by patient taken the night of the study : N/A ? ?TECHNICIAN COMMENTS ?Comments added by technician: None ?Comments added by scorer: N/A ? ?RESPIRATORY PARAMETERS ?Optimal PAP Pressure (cm): 11 AHI at Optimal Pressure (/hr): 0 ?Overall Minimal O2 (%): 83.0 Supine % at Optimal Pressure (%): 0 ?Minimal O2 at Optimal Pressure (%): 90.0  ? ?SLEEP ARCHITECTURE ?The study was initiated at 10:53:39 PM and ended at 4:52:29 AM. ? ?Sleep onset time was 21.1 minutes and the sleep efficiency was 67.7%%. The total sleep time was 243  minutes. ? ?The patient spent 11.7%% of the night in stage N1 sleep, 76.7%% in stage N2 sleep, 0.0%% in stage N3 and 11.5% in REM.Stage REM latency was 255.5 minutes ? ?Wake after sleep onset was 94.7. Alpha intrusion was absent. Supine sleep was 4.32%. ? ?CARDIAC DATA ?The 2 lead EKG demonstrated sinus rhythm. The mean heart rate was 85.2 beats per minute. Other EKG findings include: None. ? ?LEG MOVEMENT DATA ?The total Periodic Limb Movements of Sleep (PLMS) were 0. The PLMS index was 0.0. A PLMS index of <15 is considered normal in adults. ? ?IMPRESSIONS ?- CPAP was initiated at 5 cm and was titrated to optimal PAP pressure at 11 cm of water (AHI 0; O2 nadir 90%). ?- Moderate oxygen desaturations to a nadir of 83% at 10 cm of water. ?- The patient snored with soft snoring volume during this titration study. ?- No cardiac abnormalities were observed during this study. ?- Clinically significant periodic limb movements were not noted during this study. Arousals associated with PLMs were rare. ? ?DIAGNOSIS ?- Obstructive Sleep Apnea (G47.33) ? ?RECOMMENDATIONS ?- Recommend a trial of CPAP Auto therapy with EPR of 3 at 11 - 15 cm H2O with heated humidification. A Small size Resmed Nasal AirFit P30I mask was used for the titration. ?- Effort should be made to optimize nasal and oropharyngeal patency. ?- Avoid alcohol, sedatives and other CNS depressants that may worsen sleep apnea and disrupt normal sleep architecture. ?- Sleep hygiene should be reviewed to assess factors that may improve sleep quality. ?- Weight management and regular exercise should be initiated or continued. ?- Return to Sleep Center for re-evaluation after 4 weeks of therapy ? ? ?[Electronically signed] 11/21/2021 02:57 PM ? ?Shelva Majestic MD, [facility], ABSM ?Diplomate, [employer] of Sleep Medicine ? ?  NPI: [id number] ? ?[place] ?PH: [phone]   FX: [phone] ?ACCREDITED BY THE AMERICAN ACADEMY OF SLEEP  MEDICINE ? ?

## 2021-11-21 NOTE — Telephone Encounter (Signed)
Message left that her CPAP titration has been completed and CPAP machine will be ordered. She can call back if she has any questions. ?

## 2021-11-21 NOTE — Telephone Encounter (Signed)
-----   Message from Troy Sine, MD sent at 11/21/2021  3:02 PM EDT ----- ?Mariann Laster, please notify pt and set up with DME for CPAP initiation. ?

## 2022-01-07 IMAGING — DX DG ANKLE COMPLETE 3+V*L*
3 series · 3 of 3 positions shown · non-contrast
Comparison: Ankle radiograph 10/02/2019

CLINICAL DATA: Atraumatic left foot and ankle swelling for 3 days,
history of psoriasis

EXAM:
LEFT ANKLE COMPLETE - 3+ VIEW

[ankle ap]
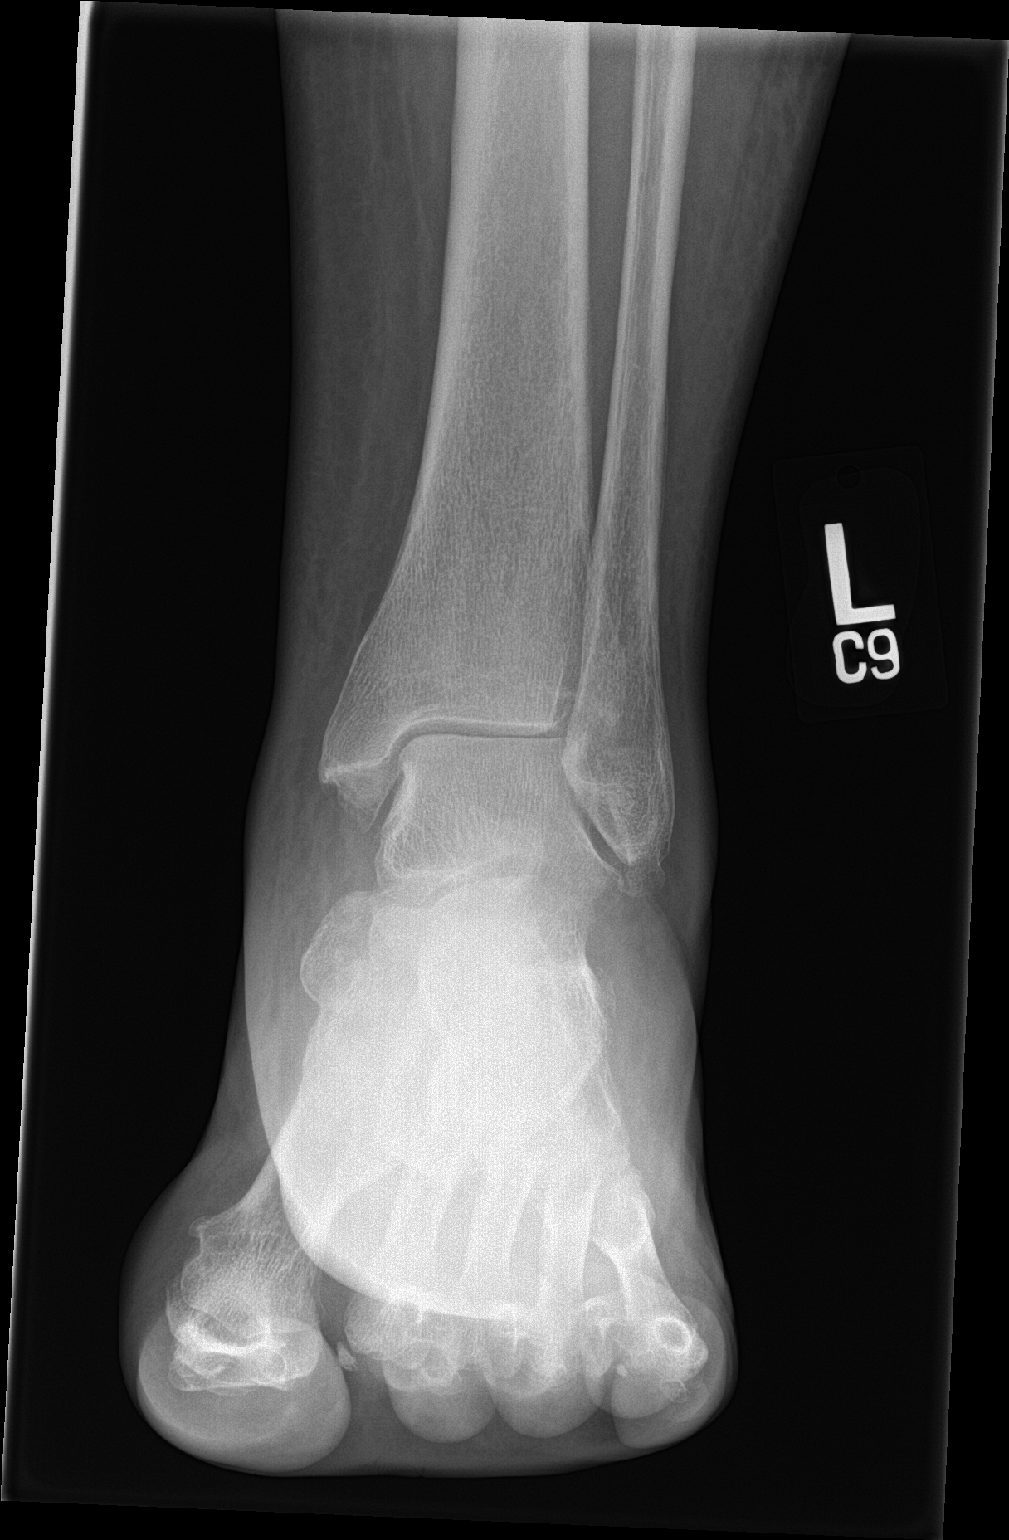

[ankle obl]
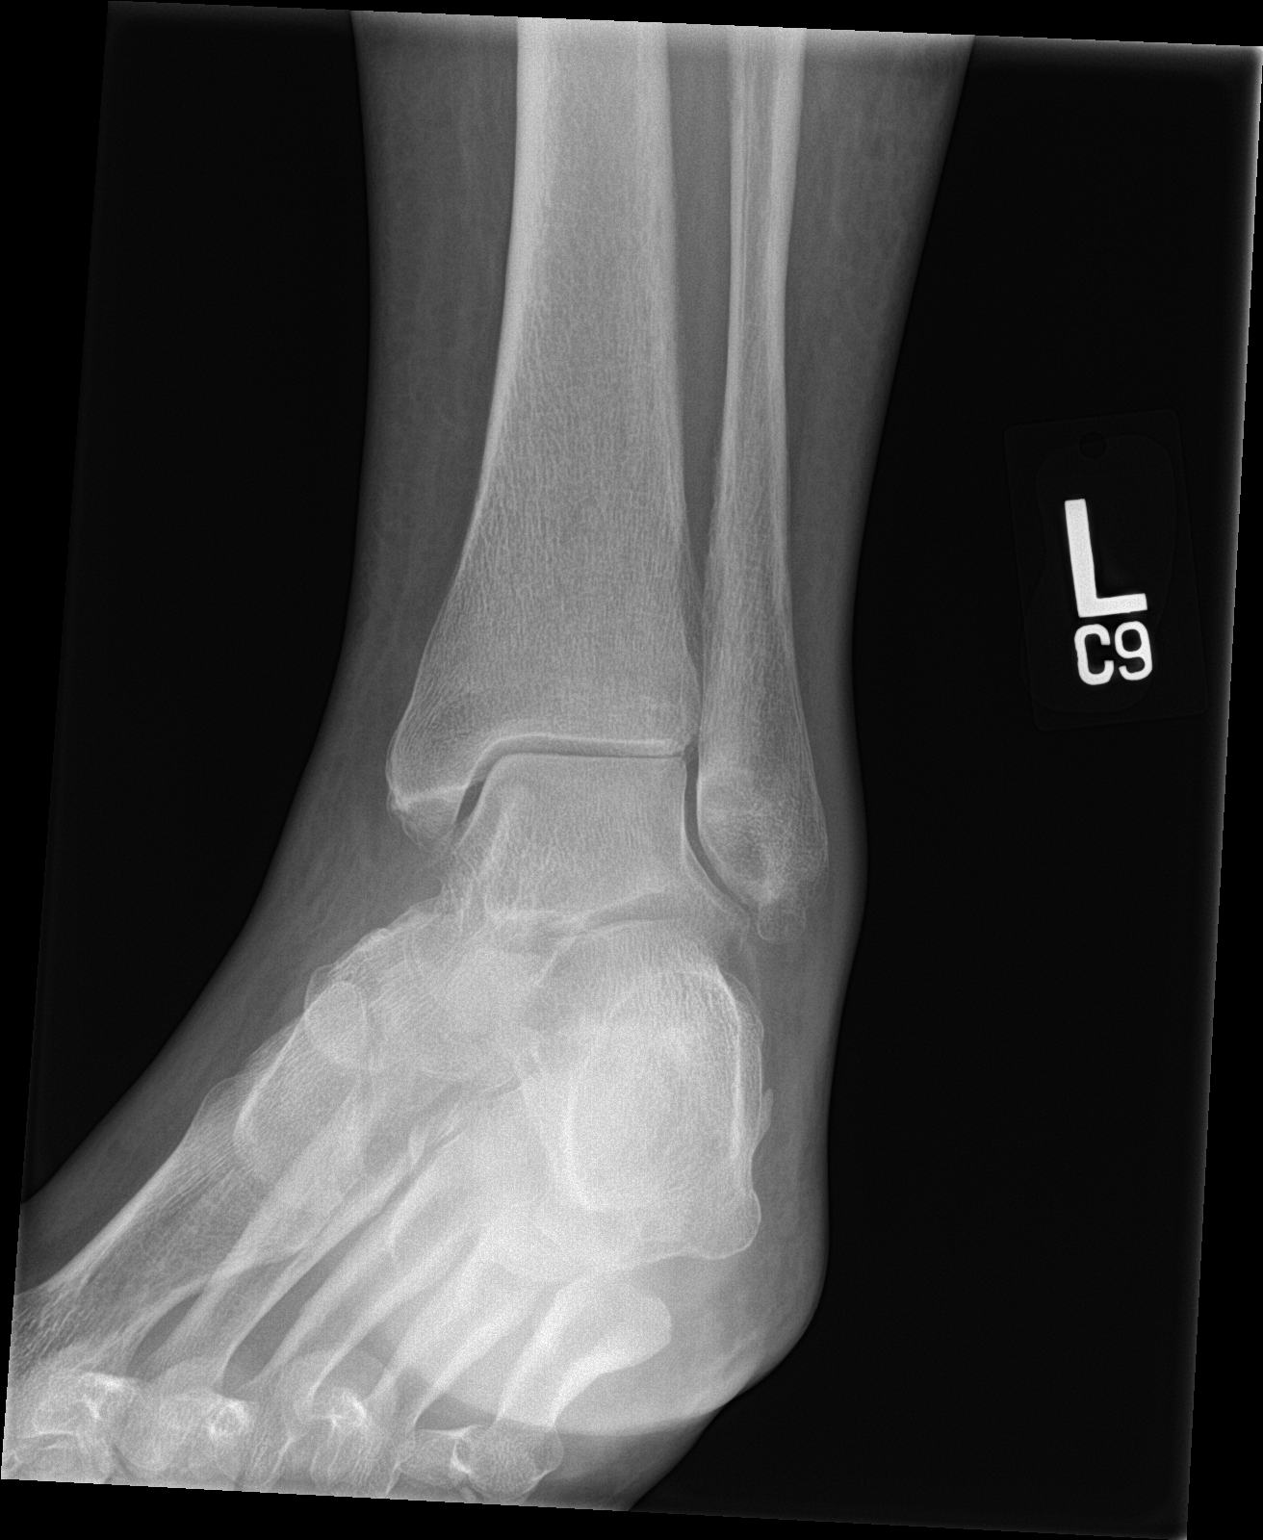

[ankle lat]
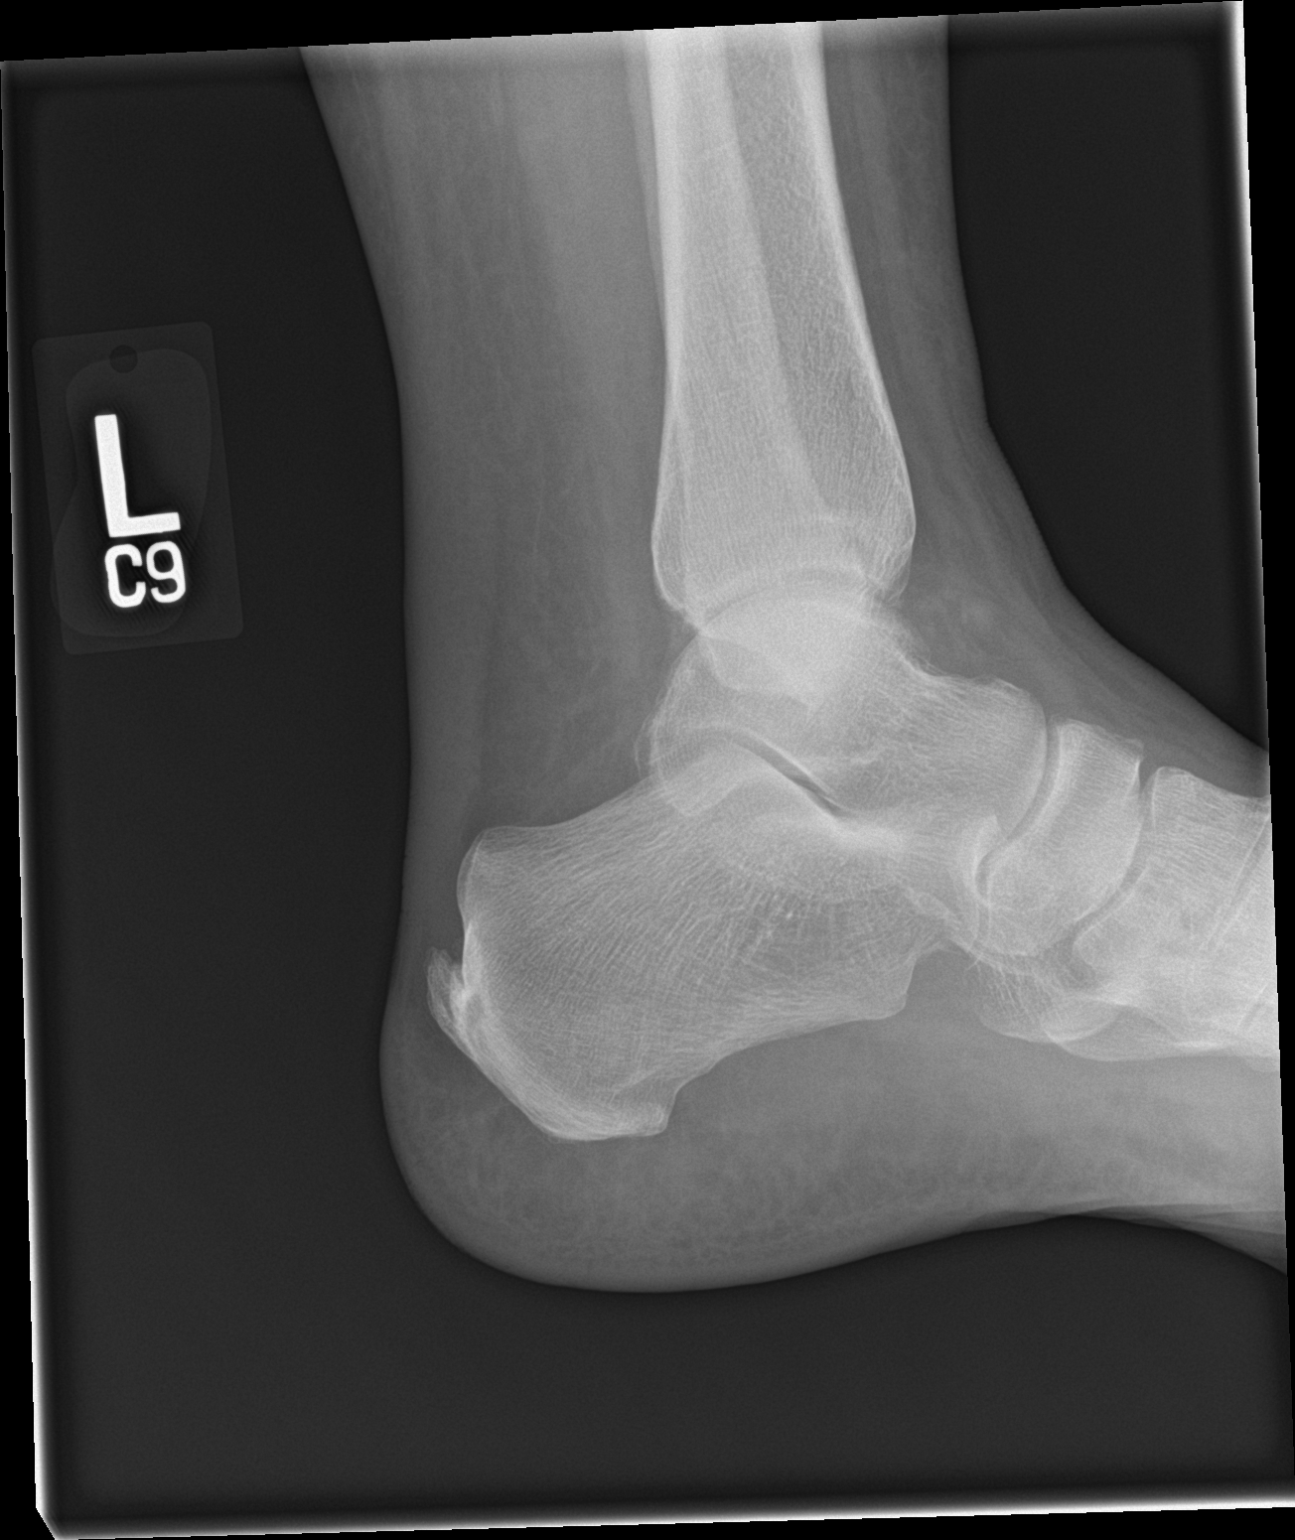

[3 of 3 positions shown; findings below may reference images not displayed]

FINDINGS: No acute bony abnormality. Specifically, no fracture, subluxation,
or dislocation. Mild arthrosis about the ankle joint with some
spurring noted at the tip of the lateral malleolus possibly
degenerative/enthesopathic in nature. No suspicious erosion or
destructive changes. Mild thickening of the Achilles tendon is
noted. No sizable effusion or other acute soft tissue abnormality.
IMPRESSION: No acute bony abnormality.

Mild arthrosis about the ankle joint with likely enthesopathic
changes along the lateral malleolus.

Nonspecific thickening of the Achilles tendon, correlate with point
tenderness.

## 2022-01-07 IMAGING — US US EXTREM LOW VENOUS*L*
1 series · 14 of 24 positions shown · non-contrast
Comparison: None.

CLINICAL DATA: Left lower extremity swelling

EXAM:
LEFT LOWER EXTREMITY VENOUS DOPPLER ULTRASOUND
TECHNIQUE: Gray-scale sonography with compression, as well as color and duplex
ultrasound, were performed to evaluate the deep venous system(s)
from the level of the common femoral vein through the popliteal and
proximal calf veins.

[Series 1: us extrem low venous*left* · 14 of 31 slices shown]
[im 1/31]
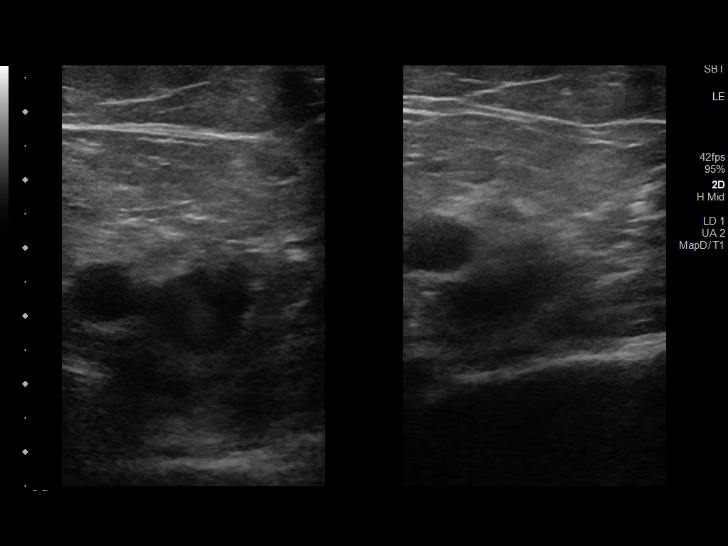
[im 3/31]
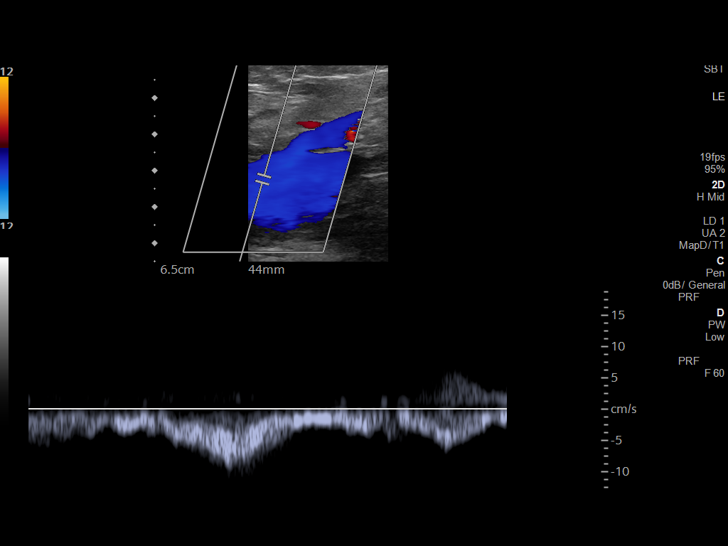
[im 6/31]
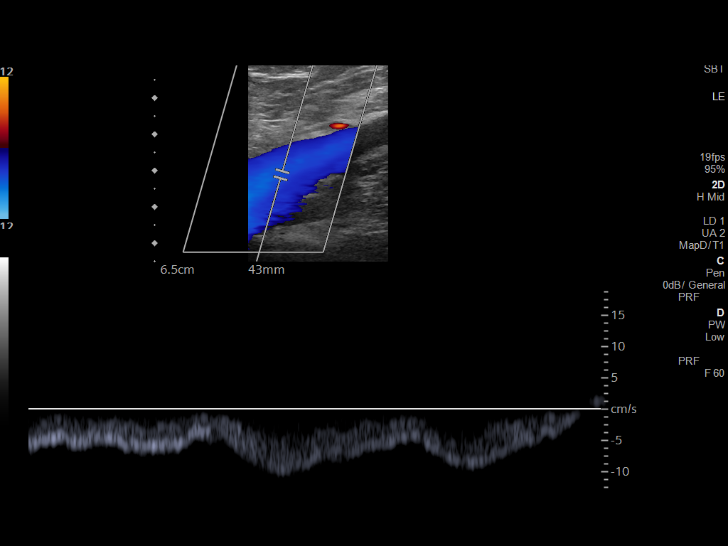
[im 8/31]
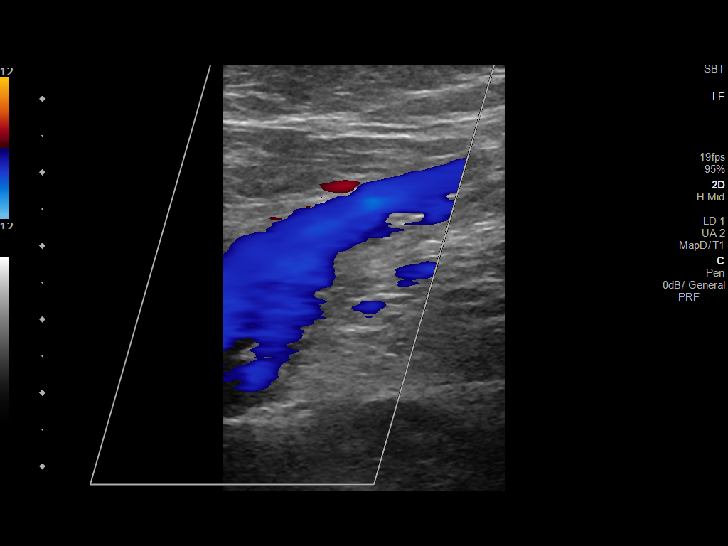
[im 10/31]
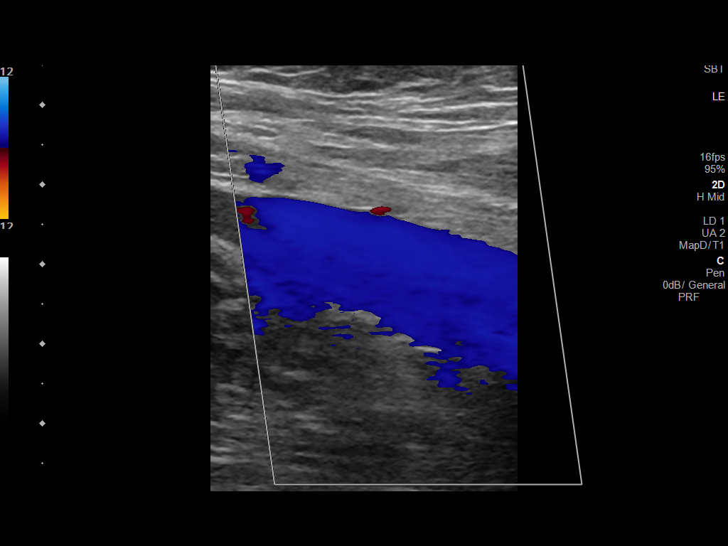
[im 12/31]
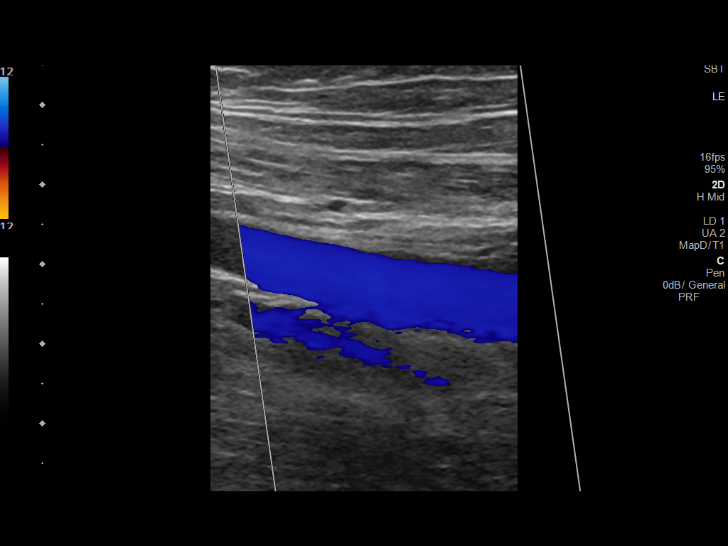
[im 15/31]
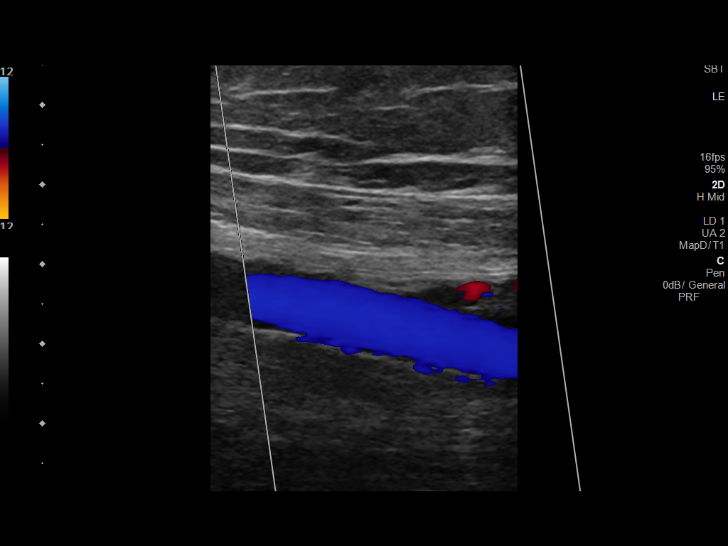
[im 16/31]
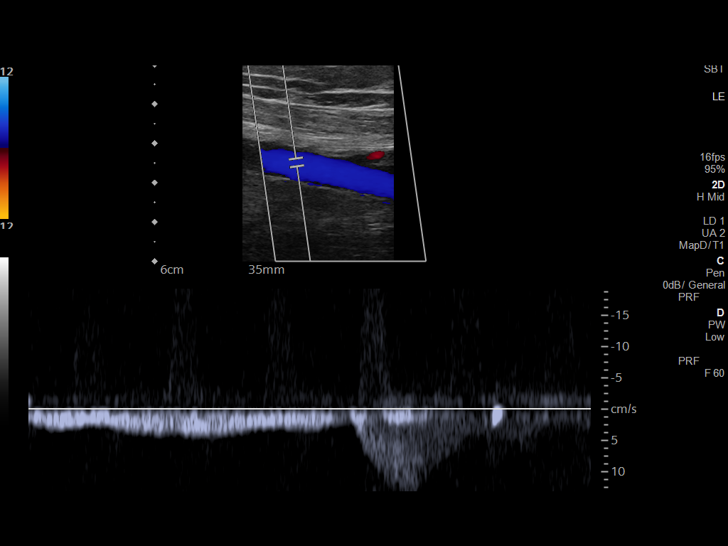
[im 19/31]
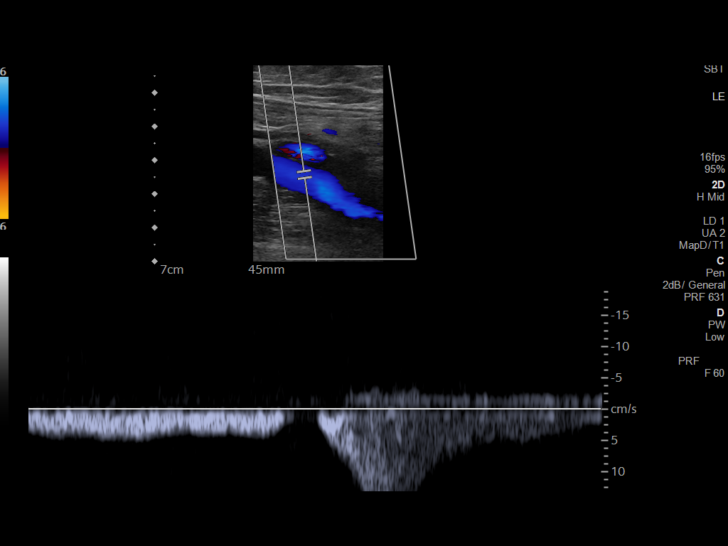
[im 21/31]
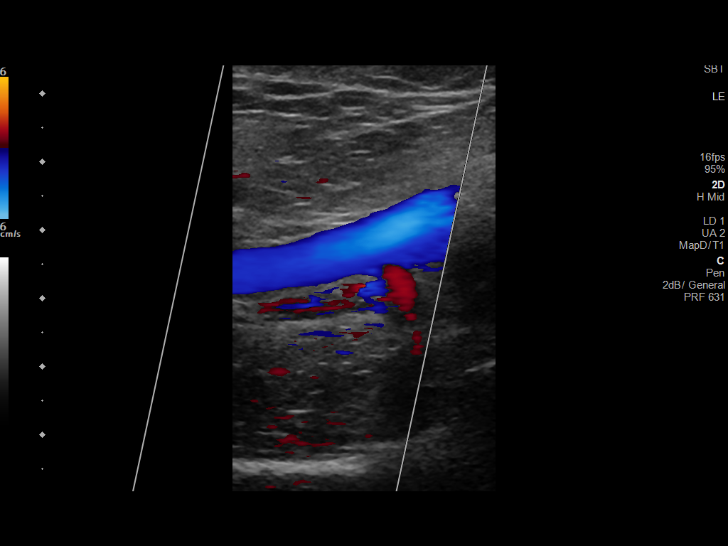
[im 24/31]
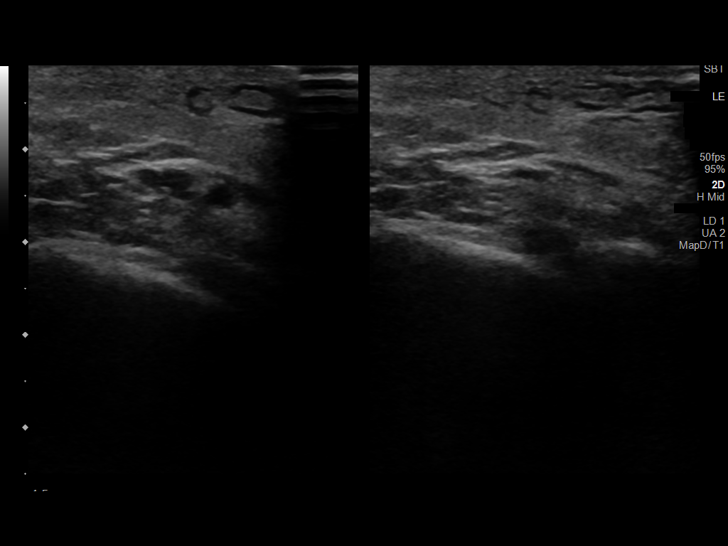
[im 25/31]
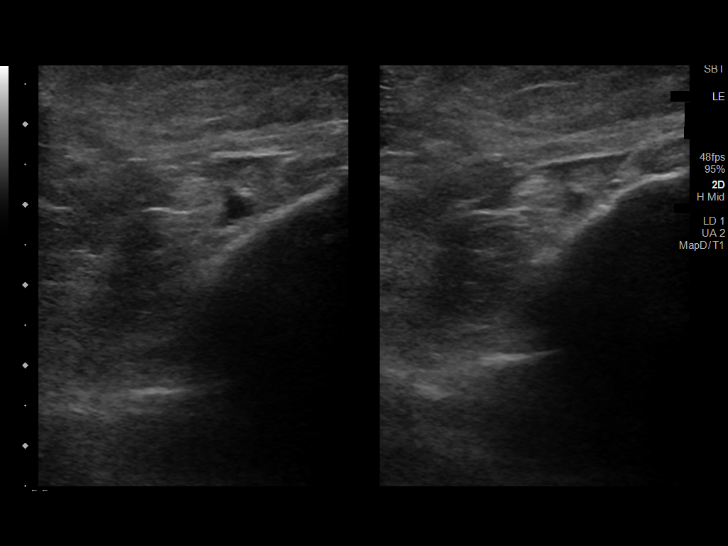
[im 28/31]
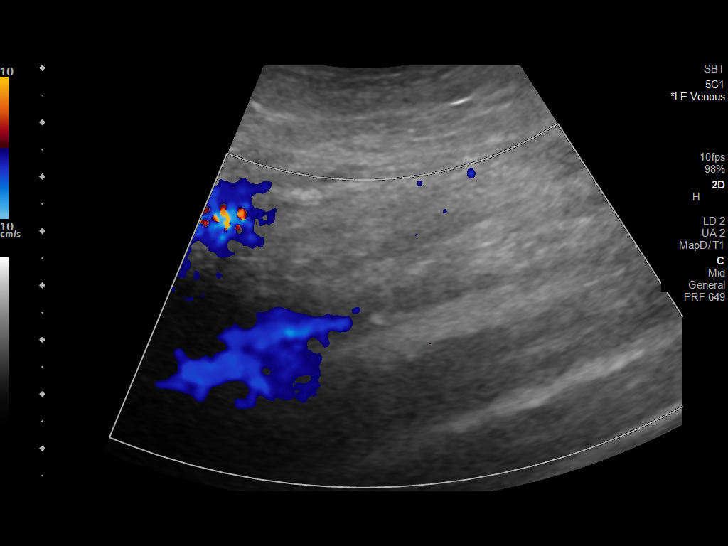
[im 31/31]
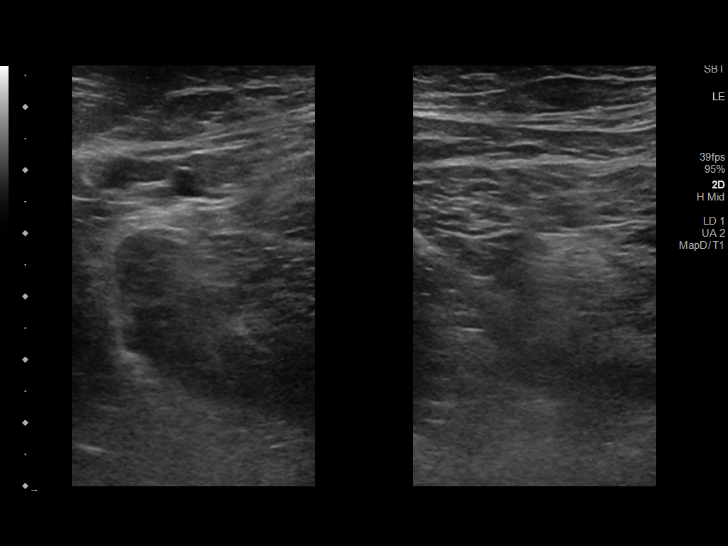

[14 of 24 positions shown; findings below may reference images not displayed]

FINDINGS: VENOUS

Normal compressibility of the common femoral, superficial femoral,
and popliteal veins, as well as the visualized calf veins.
Visualized portions of profunda femoral vein and great saphenous
vein unremarkable. No filling defects to suggest DVT on grayscale or
color Doppler imaging. Doppler waveforms show normal direction of
venous flow, normal respiratory phasicity and response to
augmentation.

Limited views of the contralateral common femoral vein are
unremarkable.

OTHER

None.

Limitations: none
IMPRESSION: No femoropopliteal DVT nor evidence of DVT within the visualized
calf veins.

## 2022-02-06 DIAGNOSIS — E7800 Pure hypercholesterolemia, unspecified: Secondary | ICD-10-CM | POA: Diagnosis not present

## 2022-02-06 DIAGNOSIS — Z0000 Encounter for general adult medical examination without abnormal findings: Secondary | ICD-10-CM | POA: Diagnosis not present

## 2022-02-06 DIAGNOSIS — I10 Essential (primary) hypertension: Secondary | ICD-10-CM | POA: Diagnosis not present

## 2022-02-06 DIAGNOSIS — E119 Type 2 diabetes mellitus without complications: Secondary | ICD-10-CM | POA: Diagnosis not present

## 2022-02-06 DIAGNOSIS — K219 Gastro-esophageal reflux disease without esophagitis: Secondary | ICD-10-CM | POA: Diagnosis not present

## 2022-02-06 DIAGNOSIS — E890 Postprocedural hypothyroidism: Secondary | ICD-10-CM | POA: Diagnosis not present

## 2022-02-06 DIAGNOSIS — H6122 Impacted cerumen, left ear: Secondary | ICD-10-CM | POA: Diagnosis not present

## 2022-04-03 DIAGNOSIS — E890 Postprocedural hypothyroidism: Secondary | ICD-10-CM | POA: Diagnosis not present

## 2022-04-03 DIAGNOSIS — Z23 Encounter for immunization: Secondary | ICD-10-CM | POA: Diagnosis not present

## 2022-04-30 DIAGNOSIS — Z1231 Encounter for screening mammogram for malignant neoplasm of breast: Secondary | ICD-10-CM | POA: Diagnosis not present

## 2022-08-26 DIAGNOSIS — E119 Type 2 diabetes mellitus without complications: Secondary | ICD-10-CM | POA: Diagnosis not present

## 2022-08-26 DIAGNOSIS — H353131 Nonexudative age-related macular degeneration, bilateral, early dry stage: Secondary | ICD-10-CM | POA: Diagnosis not present

## 2022-08-26 DIAGNOSIS — H02834 Dermatochalasis of left upper eyelid: Secondary | ICD-10-CM | POA: Diagnosis not present

## 2022-08-26 DIAGNOSIS — H02831 Dermatochalasis of right upper eyelid: Secondary | ICD-10-CM | POA: Diagnosis not present

## 2022-08-26 DIAGNOSIS — H2513 Age-related nuclear cataract, bilateral: Secondary | ICD-10-CM | POA: Diagnosis not present

## 2022-08-26 DIAGNOSIS — H35371 Puckering of macula, right eye: Secondary | ICD-10-CM | POA: Diagnosis not present

## 2022-08-26 DIAGNOSIS — H04123 Dry eye syndrome of bilateral lacrimal glands: Secondary | ICD-10-CM | POA: Diagnosis not present

## 2023-02-15 DIAGNOSIS — I10 Essential (primary) hypertension: Secondary | ICD-10-CM | POA: Diagnosis not present

## 2023-02-15 DIAGNOSIS — K219 Gastro-esophageal reflux disease without esophagitis: Secondary | ICD-10-CM | POA: Diagnosis not present

## 2023-02-15 DIAGNOSIS — E119 Type 2 diabetes mellitus without complications: Secondary | ICD-10-CM | POA: Diagnosis not present

## 2023-02-15 DIAGNOSIS — E890 Postprocedural hypothyroidism: Secondary | ICD-10-CM | POA: Diagnosis not present

## 2023-02-15 DIAGNOSIS — Z0000 Encounter for general adult medical examination without abnormal findings: Secondary | ICD-10-CM | POA: Diagnosis not present

## 2023-02-15 DIAGNOSIS — I471 Supraventricular tachycardia, unspecified: Secondary | ICD-10-CM | POA: Diagnosis not present

## 2023-02-15 DIAGNOSIS — E7800 Pure hypercholesterolemia, unspecified: Secondary | ICD-10-CM | POA: Diagnosis not present

## 2023-02-15 DIAGNOSIS — Z13820 Encounter for screening for osteoporosis: Secondary | ICD-10-CM | POA: Diagnosis not present

## 2023-04-29 DIAGNOSIS — E1165 Type 2 diabetes mellitus with hyperglycemia: Secondary | ICD-10-CM | POA: Diagnosis not present

## 2023-04-29 DIAGNOSIS — B356 Tinea cruris: Secondary | ICD-10-CM | POA: Diagnosis not present

## 2023-04-29 DIAGNOSIS — L282 Other prurigo: Secondary | ICD-10-CM | POA: Diagnosis not present

## 2023-05-06 DIAGNOSIS — Z1231 Encounter for screening mammogram for malignant neoplasm of breast: Secondary | ICD-10-CM | POA: Diagnosis not present

## 2023-09-01 DIAGNOSIS — H35371 Puckering of macula, right eye: Secondary | ICD-10-CM | POA: Diagnosis not present

## 2023-09-01 DIAGNOSIS — H353131 Nonexudative age-related macular degeneration, bilateral, early dry stage: Secondary | ICD-10-CM | POA: Diagnosis not present

## 2023-09-01 DIAGNOSIS — H02831 Dermatochalasis of right upper eyelid: Secondary | ICD-10-CM | POA: Diagnosis not present

## 2023-09-01 DIAGNOSIS — H2513 Age-related nuclear cataract, bilateral: Secondary | ICD-10-CM | POA: Diagnosis not present

## 2023-09-01 DIAGNOSIS — E119 Type 2 diabetes mellitus without complications: Secondary | ICD-10-CM | POA: Diagnosis not present

## 2023-09-01 DIAGNOSIS — H04123 Dry eye syndrome of bilateral lacrimal glands: Secondary | ICD-10-CM | POA: Diagnosis not present

## 2023-09-01 DIAGNOSIS — H02834 Dermatochalasis of left upper eyelid: Secondary | ICD-10-CM | POA: Diagnosis not present

## 2023-09-27 DIAGNOSIS — E119 Type 2 diabetes mellitus without complications: Secondary | ICD-10-CM | POA: Diagnosis not present

## 2023-12-28 DIAGNOSIS — E1169 Type 2 diabetes mellitus with other specified complication: Secondary | ICD-10-CM | POA: Diagnosis not present

## 2024-03-27 DIAGNOSIS — Z6829 Body mass index (BMI) 29.0-29.9, adult: Secondary | ICD-10-CM | POA: Diagnosis not present

## 2024-03-27 DIAGNOSIS — K219 Gastro-esophageal reflux disease without esophagitis: Secondary | ICD-10-CM | POA: Diagnosis not present

## 2024-03-27 DIAGNOSIS — Z1331 Encounter for screening for depression: Secondary | ICD-10-CM | POA: Diagnosis not present

## 2024-03-27 DIAGNOSIS — E1169 Type 2 diabetes mellitus with other specified complication: Secondary | ICD-10-CM | POA: Diagnosis not present

## 2024-03-27 DIAGNOSIS — E7800 Pure hypercholesterolemia, unspecified: Secondary | ICD-10-CM | POA: Diagnosis not present

## 2024-03-27 DIAGNOSIS — Z0000 Encounter for general adult medical examination without abnormal findings: Secondary | ICD-10-CM | POA: Diagnosis not present

## 2024-03-27 DIAGNOSIS — Z124 Encounter for screening for malignant neoplasm of cervix: Secondary | ICD-10-CM | POA: Diagnosis not present

## 2024-03-27 DIAGNOSIS — I10 Essential (primary) hypertension: Secondary | ICD-10-CM | POA: Diagnosis not present

## 2024-03-27 DIAGNOSIS — Z1231 Encounter for screening mammogram for malignant neoplasm of breast: Secondary | ICD-10-CM | POA: Diagnosis not present

## 2024-04-05 DIAGNOSIS — E875 Hyperkalemia: Secondary | ICD-10-CM | POA: Diagnosis not present

## 2024-05-25 DIAGNOSIS — Z1231 Encounter for screening mammogram for malignant neoplasm of breast: Secondary | ICD-10-CM | POA: Diagnosis not present
